# Patient Record
Sex: Female | Born: 1935 | Race: White | Hispanic: No | Smoking: Never smoker
Health system: Southern US, Community
[De-identification: ages and names within clinical notes are randomized; demographics above are authoritative.]

## PROBLEM LIST (undated history)

## (undated) DIAGNOSIS — H269 Unspecified cataract: Secondary | ICD-10-CM

## (undated) DIAGNOSIS — I350 Nonrheumatic aortic (valve) stenosis: Secondary | ICD-10-CM

## (undated) DIAGNOSIS — I10 Essential (primary) hypertension: Secondary | ICD-10-CM

## (undated) DIAGNOSIS — K219 Gastro-esophageal reflux disease without esophagitis: Secondary | ICD-10-CM

## (undated) DIAGNOSIS — R42 Dizziness and giddiness: Secondary | ICD-10-CM

## (undated) DIAGNOSIS — E039 Hypothyroidism, unspecified: Secondary | ICD-10-CM

## (undated) DIAGNOSIS — J449 Chronic obstructive pulmonary disease, unspecified: Secondary | ICD-10-CM

## (undated) HISTORY — PX: ABDOMINAL HYSTERECTOMY: SHX81

## (undated) HISTORY — PX: CATARACT EXTRACTION: SUR2

## (undated) HISTORY — PX: APPENDECTOMY: SHX54

## (undated) HISTORY — PX: CARPAL TUNNEL RELEASE: SHX101

## (undated) HISTORY — PX: CHOLECYSTECTOMY: SHX55

---

## 2004-09-07 ENCOUNTER — Ambulatory Visit: Payer: Self-pay | Admitting: Internal Medicine

## 2004-09-19 ENCOUNTER — Ambulatory Visit: Payer: Self-pay

## 2004-09-29 ENCOUNTER — Ambulatory Visit: Payer: Self-pay

## 2004-10-13 ENCOUNTER — Ambulatory Visit: Payer: Self-pay

## 2004-12-30 ENCOUNTER — Ambulatory Visit: Payer: Self-pay

## 2005-01-17 ENCOUNTER — Ambulatory Visit: Payer: Self-pay | Admitting: Internal Medicine

## 2005-06-09 ENCOUNTER — Ambulatory Visit: Payer: Self-pay | Admitting: Specialist

## 2006-02-27 ENCOUNTER — Ambulatory Visit: Payer: Self-pay | Admitting: Specialist

## 2006-03-05 ENCOUNTER — Ambulatory Visit: Payer: Self-pay | Admitting: Internal Medicine

## 2006-05-18 ENCOUNTER — Ambulatory Visit: Payer: Self-pay | Admitting: Physician Assistant

## 2006-06-01 ENCOUNTER — Ambulatory Visit: Payer: Self-pay | Admitting: Internal Medicine

## 2006-06-05 ENCOUNTER — Ambulatory Visit: Payer: Self-pay | Admitting: Internal Medicine

## 2006-06-27 ENCOUNTER — Other Ambulatory Visit: Payer: Self-pay

## 2006-07-05 ENCOUNTER — Inpatient Hospital Stay: Payer: Self-pay | Admitting: Unknown Physician Specialty

## 2006-07-10 ENCOUNTER — Encounter: Payer: Self-pay | Admitting: Internal Medicine

## 2006-07-30 ENCOUNTER — Encounter: Payer: Self-pay | Admitting: Internal Medicine

## 2006-12-18 ENCOUNTER — Ambulatory Visit: Payer: Self-pay | Admitting: Specialist

## 2007-03-14 ENCOUNTER — Ambulatory Visit: Payer: Self-pay | Admitting: Internal Medicine

## 2007-05-02 ENCOUNTER — Ambulatory Visit: Payer: Self-pay | Admitting: Unknown Physician Specialty

## 2007-05-08 ENCOUNTER — Ambulatory Visit: Payer: Self-pay | Admitting: Internal Medicine

## 2007-07-17 ENCOUNTER — Ambulatory Visit: Payer: Self-pay | Admitting: Ophthalmology

## 2007-08-01 ENCOUNTER — Ambulatory Visit: Payer: Self-pay | Admitting: Unknown Physician Specialty

## 2007-08-01 ENCOUNTER — Other Ambulatory Visit: Payer: Self-pay

## 2007-08-06 ENCOUNTER — Ambulatory Visit: Payer: Self-pay | Admitting: Unknown Physician Specialty

## 2007-08-12 ENCOUNTER — Ambulatory Visit: Payer: Self-pay | Admitting: Ophthalmology

## 2007-08-21 ENCOUNTER — Ambulatory Visit: Payer: Self-pay | Admitting: Ophthalmology

## 2007-10-11 ENCOUNTER — Ambulatory Visit: Payer: Self-pay | Admitting: Physician Assistant

## 2008-01-20 ENCOUNTER — Ambulatory Visit: Payer: Self-pay | Admitting: Unknown Physician Specialty

## 2008-03-16 ENCOUNTER — Ambulatory Visit: Payer: Self-pay | Admitting: Internal Medicine

## 2009-03-30 ENCOUNTER — Ambulatory Visit: Payer: Self-pay | Admitting: Internal Medicine

## 2010-01-20 ENCOUNTER — Ambulatory Visit: Payer: Self-pay | Admitting: Specialist

## 2010-04-15 ENCOUNTER — Ambulatory Visit: Payer: Self-pay | Admitting: Internal Medicine

## 2010-06-09 ENCOUNTER — Ambulatory Visit: Payer: Self-pay | Admitting: Specialist

## 2010-08-22 ENCOUNTER — Emergency Department: Payer: Self-pay | Admitting: Emergency Medicine

## 2010-09-10 ENCOUNTER — Emergency Department: Payer: Self-pay | Admitting: Emergency Medicine

## 2010-09-14 ENCOUNTER — Encounter: Payer: Self-pay | Admitting: Internal Medicine

## 2010-09-29 ENCOUNTER — Encounter: Payer: Self-pay | Admitting: Internal Medicine

## 2010-11-11 ENCOUNTER — Emergency Department: Payer: Self-pay | Admitting: Emergency Medicine

## 2010-12-12 ENCOUNTER — Ambulatory Visit: Payer: Self-pay | Admitting: Internal Medicine

## 2010-12-14 ENCOUNTER — Ambulatory Visit: Payer: Self-pay | Admitting: Internal Medicine

## 2010-12-29 ENCOUNTER — Ambulatory Visit: Payer: Self-pay | Admitting: Internal Medicine

## 2011-01-29 ENCOUNTER — Ambulatory Visit: Payer: Self-pay | Admitting: Internal Medicine

## 2011-03-22 ENCOUNTER — Ambulatory Visit: Payer: Self-pay | Admitting: Internal Medicine

## 2011-03-31 ENCOUNTER — Ambulatory Visit: Payer: Self-pay | Admitting: Internal Medicine

## 2011-05-17 ENCOUNTER — Ambulatory Visit: Payer: Self-pay | Admitting: Internal Medicine

## 2011-05-31 ENCOUNTER — Ambulatory Visit: Payer: Self-pay | Admitting: Internal Medicine

## 2011-06-05 ENCOUNTER — Ambulatory Visit: Payer: Self-pay | Admitting: Internal Medicine

## 2011-09-06 ENCOUNTER — Ambulatory Visit: Payer: Self-pay | Admitting: Internal Medicine

## 2011-09-30 ENCOUNTER — Ambulatory Visit: Payer: Self-pay | Admitting: Internal Medicine

## 2011-12-27 ENCOUNTER — Ambulatory Visit: Payer: Self-pay | Admitting: Internal Medicine

## 2011-12-27 LAB — IRON AND TIBC
Iron Bind.Cap.(Total): 333 ug/dL (ref 250–450)
Iron: 63 ug/dL (ref 50–170)
Unbound Iron-Bind.Cap.: 270 ug/dL

## 2011-12-29 ENCOUNTER — Ambulatory Visit: Payer: Self-pay | Admitting: Internal Medicine

## 2012-04-22 ENCOUNTER — Ambulatory Visit: Payer: Self-pay | Admitting: Internal Medicine

## 2012-04-22 LAB — CBC CANCER CENTER
Eosinophil %: 1.9 %
HCT: 34.7 % — ABNORMAL LOW (ref 35.0–47.0)
HGB: 11.1 g/dL — ABNORMAL LOW (ref 12.0–16.0)
MCHC: 31.9 g/dL — ABNORMAL LOW (ref 32.0–36.0)
MCV: 94 fL (ref 80–100)
Monocyte #: 0.5 x10 3/mm (ref 0.2–0.9)
Monocyte %: 6.7 %
Neutrophil #: 4.4 x10 3/mm (ref 1.4–6.5)
Platelet: 128 x10 3/mm — ABNORMAL LOW (ref 150–440)
RBC: 3.67 10*6/uL — ABNORMAL LOW (ref 3.80–5.20)
RDW: 14.2 % (ref 11.5–14.5)
WBC: 7.6 x10 3/mm (ref 3.6–11.0)

## 2012-04-22 LAB — IRON AND TIBC
Iron Bind.Cap.(Total): 352 ug/dL (ref 250–450)
Iron Saturation: 26 %
Iron: 93 ug/dL (ref 50–170)
Unbound Iron-Bind.Cap.: 259 ug/dL

## 2012-04-22 LAB — FERRITIN: Ferritin (ARMC): 14 ng/mL (ref 8–388)

## 2012-04-29 ENCOUNTER — Ambulatory Visit: Payer: Self-pay | Admitting: Internal Medicine

## 2012-05-23 ENCOUNTER — Ambulatory Visit: Payer: Self-pay | Admitting: Orthopedic Surgery

## 2012-06-05 ENCOUNTER — Ambulatory Visit: Payer: Self-pay | Admitting: Internal Medicine

## 2012-08-30 ENCOUNTER — Emergency Department: Payer: Self-pay | Admitting: Emergency Medicine

## 2012-08-30 LAB — COMPREHENSIVE METABOLIC PANEL
Albumin: 3.4 g/dL (ref 3.4–5.0)
Alkaline Phosphatase: 85 U/L (ref 50–136)
Anion Gap: 7 (ref 7–16)
BUN: 28 mg/dL — ABNORMAL HIGH (ref 7–18)
Bilirubin,Total: 0.4 mg/dL (ref 0.2–1.0)
Calcium, Total: 9.4 mg/dL (ref 8.5–10.1)
Chloride: 105 mmol/L (ref 98–107)
Creatinine: 1.14 mg/dL (ref 0.60–1.30)
EGFR (African American): 49 — ABNORMAL LOW
Glucose: 104 mg/dL — ABNORMAL HIGH (ref 65–99)
Osmolality: 289 (ref 275–301)
Potassium: 3.9 mmol/L (ref 3.5–5.1)
SGOT(AST): 17 U/L (ref 15–37)
Sodium: 142 mmol/L (ref 136–145)

## 2012-08-30 LAB — URINALYSIS, COMPLETE
Bilirubin,UR: NEGATIVE
Glucose,UR: NEGATIVE mg/dL (ref 0–75)
Leukocyte Esterase: NEGATIVE
Nitrite: NEGATIVE
Protein: NEGATIVE
RBC,UR: <1 /HPF (ref 0–5)
Specific Gravity: 1.006 (ref 1.003–1.030)
Squamous Epithelial: NONE SEEN
WBC UR: <1 /HPF (ref 0–5)

## 2012-08-30 LAB — CBC
HCT: 33.9 % — ABNORMAL LOW (ref 35.0–47.0)
HGB: 11.3 g/dL — ABNORMAL LOW (ref 12.0–16.0)
MCH: 30.8 pg (ref 26.0–34.0)
MCHC: 33.4 g/dL (ref 32.0–36.0)

## 2013-06-11 ENCOUNTER — Ambulatory Visit: Payer: Self-pay | Admitting: Internal Medicine

## 2014-06-17 ENCOUNTER — Ambulatory Visit: Payer: Self-pay | Admitting: Family Medicine

## 2015-01-18 ENCOUNTER — Inpatient Hospital Stay: Payer: Self-pay | Admitting: Internal Medicine

## 2015-01-20 ENCOUNTER — Encounter
Admit: 2015-01-20 | Disposition: A | Discharge status: Home / Self Care | Payer: Self-pay | Attending: Internal Medicine | Admitting: Internal Medicine

## 2015-01-21 LAB — BASIC METABOLIC PANEL
ANION GAP: 4 — AB (ref 7–16)
BUN: 30 mg/dL — ABNORMAL HIGH
CALCIUM: 8.7 mg/dL — AB
CO2: 28 mmol/L
Chloride: 109 mmol/L
Creatinine: 1.04 mg/dL — ABNORMAL HIGH
GFR CALC AF AMER: 54 — AB
GFR CALC NON AF AMER: 47 — AB
GLUCOSE: 105 mg/dL — AB
Potassium: 3.3 mmol/L — ABNORMAL LOW
Sodium: 141 mmol/L

## 2015-01-21 LAB — PLATELET COUNT: PLATELETS: 133 10*3/uL — AB (ref 150–440)

## 2015-01-22 LAB — CLOSTRIDIUM DIFFICILE(ARMC)

## 2015-01-23 LAB — COMPREHENSIVE METABOLIC PANEL
ANION GAP: 6 — AB (ref 7–16)
Albumin: 3 g/dL — ABNORMAL LOW
Alkaline Phosphatase: 47 U/L
BUN: 32 mg/dL — AB
Bilirubin,Total: 0.5 mg/dL
Calcium, Total: 8.9 mg/dL
Chloride: 118 mmol/L — ABNORMAL HIGH
Co2: 20 mmol/L — ABNORMAL LOW
Creatinine: 1.23 mg/dL — ABNORMAL HIGH
EGFR (Non-African Amer.): 38 — ABNORMAL LOW
GFR CALC AF AMER: 44 — AB
GLUCOSE: 86 mg/dL
POTASSIUM: 3.5 mmol/L
SGOT(AST): 28 U/L
SGPT (ALT): 32 U/L
SODIUM: 144 mmol/L
TOTAL PROTEIN: 5.8 g/dL — AB

## 2015-01-23 LAB — CBC WITH DIFFERENTIAL/PLATELET
BASOS ABS: 0 10*3/uL (ref 0.0–0.1)
BASOS PCT: 0.3 %
EOS ABS: 0.2 10*3/uL (ref 0.0–0.7)
Eosinophil %: 2.5 %
HCT: 31.2 % — ABNORMAL LOW (ref 35.0–47.0)
HGB: 9.7 g/dL — AB (ref 12.0–16.0)
LYMPHS PCT: 11.1 %
Lymphocyte #: 0.8 10*3/uL — ABNORMAL LOW (ref 1.0–3.6)
MCH: 24.3 pg — ABNORMAL LOW (ref 26.0–34.0)
MCHC: 31 g/dL — AB (ref 32.0–36.0)
MCV: 79 fL — ABNORMAL LOW (ref 80–100)
Monocyte #: 0.4 x10 3/mm (ref 0.2–0.9)
Monocyte %: 6 %
NEUTROS PCT: 80.1 %
Neutrophil #: 5.9 10*3/uL (ref 1.4–6.5)
PLATELETS: 168 10*3/uL (ref 150–440)
RBC: 3.98 10*6/uL (ref 3.80–5.20)
RDW: 18.6 % — ABNORMAL HIGH (ref 11.5–14.5)
WBC: 7.3 10*3/uL (ref 3.6–11.0)

## 2015-01-23 LAB — TSH: THYROID STIMULATING HORM: 0.195 u[IU]/mL — AB

## 2015-01-24 LAB — RAPID INFLUENZA A&B ANTIGENS

## 2015-01-25 LAB — CBC WITH DIFFERENTIAL/PLATELET
BASOS ABS: 0 10*3/uL (ref 0.0–0.1)
Basophil %: 0.6 %
EOS ABS: 0.2 10*3/uL (ref 0.0–0.7)
EOS PCT: 2.8 %
HCT: 31.2 % — ABNORMAL LOW (ref 35.0–47.0)
HGB: 9.7 g/dL — AB (ref 12.0–16.0)
LYMPHS PCT: 13.4 %
Lymphocyte #: 1.1 10*3/uL (ref 1.0–3.6)
MCH: 24.5 pg — ABNORMAL LOW (ref 26.0–34.0)
MCHC: 31.1 g/dL — AB (ref 32.0–36.0)
MCV: 79 fL — ABNORMAL LOW (ref 80–100)
Monocyte #: 0.5 x10 3/mm (ref 0.2–0.9)
Monocyte %: 6.3 %
NEUTROS PCT: 76.9 %
Neutrophil #: 6.1 10*3/uL (ref 1.4–6.5)
Platelet: 163 10*3/uL (ref 150–440)
RBC: 3.96 10*6/uL (ref 3.80–5.20)
RDW: 18.4 % — ABNORMAL HIGH (ref 11.5–14.5)
WBC: 7.9 10*3/uL (ref 3.6–11.0)

## 2015-01-25 LAB — BASIC METABOLIC PANEL
Anion Gap: 4 — ABNORMAL LOW (ref 7–16)
Anion Gap: 5 — ABNORMAL LOW (ref 7–16)
BUN: 26 mg/dL — ABNORMAL HIGH
BUN: 17 mg/dL
CHLORIDE: 110 mmol/L
CO2: 30 mmol/L
CREATININE: 0.99 mg/dL
CREATININE: 1.12 mg/dL — AB
Calcium, Total: 8.9 mg/dL
Calcium, Total: 8 mg/dL — ABNORMAL LOW
Chloride: 118 mmol/L — ABNORMAL HIGH
Co2: 18 mmol/L — ABNORMAL LOW
GFR CALC AF AMER: 58 — AB
GFR CALC AF AMER: 50 — AB
GFR CALC NON AF AMER: 50 — AB
GFR CALC NON AF AMER: 43 — AB
GLUCOSE: 113 mg/dL — AB
Glucose: 74 mg/dL
Potassium: 3.4 mmol/L — ABNORMAL LOW
Potassium: 3.8 mmol/L
Sodium: 144 mmol/L
Sodium: 141 mmol/L

## 2015-01-29 ENCOUNTER — Encounter
Admit: 2015-01-29 | Disposition: A | Discharge status: Home / Self Care | Payer: Self-pay | Attending: Internal Medicine | Admitting: Internal Medicine

## 2015-02-15 LAB — URINALYSIS, COMPLETE
Bilirubin,UR: NEGATIVE
Blood: NEGATIVE
Glucose,UR: NEGATIVE mg/dL (ref 0–75)
KETONE: NEGATIVE
Leukocyte Esterase: NEGATIVE
Nitrite: NEGATIVE
PH: 5 (ref 4.5–8.0)
PROTEIN: NEGATIVE
SPECIFIC GRAVITY: 1.008 (ref 1.003–1.030)
SQUAMOUS EPITHELIAL: NONE SEEN

## 2015-02-17 LAB — URINE CULTURE

## 2015-02-28 ENCOUNTER — Encounter
Admission: RE | Admit: 2015-02-28 | Discharge: 2015-02-28 | Disposition: A | Discharge status: Home / Self Care | Payer: Medicare Other | Source: Ambulatory Visit | Attending: Internal Medicine | Admitting: Internal Medicine

## 2015-02-28 NOTE — Consult Note (Signed)
Chief Complaint:  Subjective/Chief Complaint Patient is still with recurrent persistent shortness of breath and intermittent chest pain no leg edema no sputum production not sleeping really well   VITAL SIGNS/ANCILLARY NOTES: **Vital Signs.:   21-Mar-16 08:11  Vital Signs Type Q 4hr  Celsius 36.2  Temperature Source oral  Pulse Pulse 81  Respirations Respirations 18  Systolic BP Systolic BP 563  Diastolic BP (mmHg) Diastolic BP (mmHg) 76  Mean BP 110  Pulse Ox % Pulse Ox % 99  Pulse Ox Activity Level  At rest  Oxygen Delivery 1L; Nasal Cannula  *Intake and Output.:   Daily 21-Mar-16 07:00  Grand Totals Intake:   Output:  200    Net:  -200 24 Hr.:  -200  Urine ml     Out:  200  Length of Stay Totals Intake:   Output:  325    Net:  -325   Brief Assessment:  GEN well developed, well nourished, no acute distress, thin   Cardiac murmur present  + thrills  -- carotid bruits  + LE edema   Respiratory normal resp effort  clear BS  rhonchi  diminished breath sounds bilateral   Gastrointestinal details normal Soft   EXTR negative cyanosis/clubbing, positive edema   Lab Results: Routine Chem:  21-Mar-16 04:19   Glucose, Serum  144 (65-99 NOTE: New Reference Range  12/22/14)  BUN  37 (6-20 NOTE: New Reference Range  12/22/14)  Creatinine (comp)  1.18 (0.44-1.00 NOTE: New Reference Range  12/22/14)  Sodium, Serum 142 (135-145 NOTE: New Reference Range  12/22/14)  Potassium, Serum 3.7 (3.5-5.1 NOTE: New Reference Range  12/22/14)  Chloride, Serum 107 (101-111 NOTE: New Reference Range  12/22/14)  CO2, Serum 27 (22-32 NOTE: New Reference Range  12/22/14)  Calcium (Total), Serum 9.1 (8.9-10.3 NOTE: New Reference Range  12/22/14)  Anion Gap 8  eGFR (African American)  47  eGFR (Non-African American)  40 (eGFR values <39m/min/1.73 m2 may be an indication of chronic kidney disease (CKD). Calculated eGFR is useful in patients with stable renal function. The eGFR  calculation will not be reliable in acutely ill patients when serum creatinine is changing rapidly. It is not useful in patients on dialysis. The eGFR calculation may not be applicable to patients at the low and high extremes of body sizes, pregnant women, and vegetarians.)   Radiology Results: XRay:    19-Mar-16 15:53, Chest PA and Lateral  Chest PA and Lateral   REASON FOR EXAM:    worsening SOB and cough, hx of COPD  COMMENTS:       PROCEDURE: DXR - DXR CHEST PA (OR AP) AND LATERAL  - Jan 16 2015  3:53PM     CLINICAL DATA:  Worsening shortness of Breath    EXAM:  CHEST  2 VIEW    COMPARISON:  09/10/2010    FINDINGS:  The cardiac shadow is mildly enlarged. Central vascular congestion  is seen some right basilar atelectasis with associated small  effusion is noted. Minimal interstitial edema is seen. No focal  confluent infiltrate is noted.     IMPRESSION:  Mild vascular congestion with associated small right pleural  effusion. Some right basilar atelectasis is noted as well.      Electronically Signed    By: MInez CatalinaM.D.    On: 01/16/2015 16:15         Verified By: MEverlene Farrier M.D.,  Cardiology:    19-Mar-16 15:26, ECG  Ventricular Rate 68  Atrial  Rate 277  QRS Duration 126  QT 376  QTc 399  R Axis -55  T Axis 61  ECG interpretation   Atrial fibrillation  Left axis deviation  Non-specific intra-ventricular conduction block  Cannot rule out Anteroseptal infarct , age undetermined  Abnormal ECG  No previous ECGs available  ----------unconfirmed----------  Confirmed by OVERREAD, NOT ([id number]), editor PEARSON, BARBARA ([id number]) on 01/18/2015 7:58:26 AM  ECG     20-Mar-16 09:44, Echo Doppler  Echo Doppler   REASON FOR EXAM:      COMMENTS:       PROCEDURE: [place] - ECHO DOPPLER COMPLETE(TRANSTHOR)  - Jan 17 2015  9:44AM     RESULT: Echocardiogram Report    Patient Name:   Latasha Beck Date of Exam: 01/17/2015  Medical Rec #:  [id number]        Custom1:  Date  ofBirth:  [birth year]     Height:       65.0 in  Patient Age:    81 years      Weight:       127.0 lb  Patient Gender: F             BSA:          1.63 m??    Indications: Atrial Fib  Sonographer:    Janalee Dane RCS  Referring Phys: Loletha Grayer, J    Summary:   1. Left ventricular ejection fraction, by visual estimation, is 70 to   75%.   2. Normal global left ventricular systolic function.   3. Mildly dilated left atrium.   4. Mildly dilated right atrium.   5. Severe aortic valve stenosis.  6. Moderately elevated pulmonary artery systolic pressure.   7. Mild to moderate tricuspid regurgitation.  2D AND M-MODE MEASUREMENTS (normal ranges within parentheses):  Left Ventricle:          Normal  IVSd (2D):      0.93 cm (0.7-1.1)  LVPWd (2D):    0.95 cm (0.7-1.1) Aorta/LA:                  Normal  LVIDd (2D):     4.57 cm (3.4-5.7) Aortic Root (2D): 2.70 cm (2.4-3.7)  LVIDs (2D):     2.44 cm           Left Atrium (2D): 4.30 cm (1.9-4.0)  LV FS (2D):     46.6 %   (>25%)  LV EF (2D):     78.1 %   (>50%)                                    Right Ventricle:                                    RVd (2D):        2.95 cm  LV DIASTOLIC FUNCTION:  MV Peak E: 1.45 m/s E/e' Ratio: 14.60  MV Peak A: 0.78 m/s Decel Time: 208 msec  E/A Ratio: 1.85  SPECTRAL DOPPLER ANALYSIS (where applicable):  Mitral Valve:  MV P1/2 Time: 60.32 msec  MV Area, PHT: 3.65 cm??  Aortic Valve: AoV Max Vel: 4.76 m/s AoV Peak PG: 90.5 mmHg AoV Mean PG:     51.0 mmHg  LVOT Vmax: 1.12 m/s LVOT VTI: 0.223 m LVOT Diameter: 1.80 cm  AoV Area, Vmax:  0.60 cm?? AoV Area, VTI: 0.57 cm?? AoV Area, Vmn: 0.64 cm??  Tricuspid Valve and PA/RV Systolic Pressure: TR Max Velocity: 3.26 m/s RA   Pressure: 10 mmHg RVSP/PASP: 52.4 mmHg    PHYSICIAN INTERPRETATION:  Left Ventricle: The leftventricular internal cavity size was normal. LV   septal wall thickness was normal. LV posterior wall thickness was normal.   Global LV  systolic function was normal. Left ventricular ejection   fraction, by visual estimation, is 70 to 75%.  Right Ventricle: The right ventricular size is mildly enlarged. Global RV   systolic function is normal.  Left Atrium: The left atrium is mildly dilated.  Right Atrium: The right atrium is mildly dilated.  Pericardium: There is no evidence of pericardial effusion.  Mitral Valve: The mitral valve is normal in structure. Trace mitral valve   regurgitation is seen.  Tricuspid Valve: The tricuspid valve is normal. Mild to moderate   tricuspid regurgitation is visualized. The tricuspid regurgitant velocity   is3.26 m/s, and with an assumed right atrial pressure of 10 mmHg, the   estimated right ventricular systolic pressure is moderately elevated at   52.4 mmHg.  Aortic Valve: Severe aortic stenosis is present. Trivial aortic valve   regurgitation is seen.  Pulmonic Valve: The pulmonic valve is normal. No indication of pulmonic   valve regurgitation.    [place] MD  Electronically signed by [place] MD  Signature Date/Time: 01/17/2015/3:41:42 PM    *** Final ***    IMPRESSION: .        Verified By: Yolonda Kida, M.D., MD   Assessment/Plan:  Assessment/Plan:  Assessment IMP   acute on chronic congestive heart failure diastolic  severe aortic stenosis symptomatic with heart failure  hypertension  renal insufficiency  hyperlipidemia  shortness of breath  insomnia  unstable angina  borderline troponin .   Plan continued telemetry  continue Lasix therapy for shortness of breath and heart failure  beta-blockade therapy  low-dose ACE-inhibitor therapy  rule out for myocardial infarction  DVT prophylaxis  aspirin therapy for appears ASCVD vascular disease  lipid management with statin therapy  physical therapy for ataxia and gait training  consider cardiac catheterization for evaluation of aortic valve  unstable angina and heart failure   consider intervention for symptomatic aortic valve  case discussed with hospitalist  will discuss case with  daughter   Electronic Signatures: Lujean Amel D (MD)  (Signed 21-Mar-16 11:10)  Authored: Chief Complaint, VITAL SIGNS/ANCILLARY NOTES, Brief Assessment, Lab Results, Radiology Results, Assessment/Plan   Last Updated: 21-Mar-16 11:10 by Lujean Amel D (MD)

## 2015-02-28 NOTE — Consult Note (Signed)
Chief Complaint:  Subjective/Chief Complaint The patient has agreed to undergo cardiac catheterization because of persistent shortness of breath and evaluation of coronary disease possibly as well as aortic stenosis   VITAL SIGNS/ANCILLARY NOTES: **Vital Signs.:   22-Mar-16 11:35  Vital Signs Type Routine  Temperature Temperature (F) 98.6  Celsius 37  Temperature Source oral  Pulse Pulse 81  Respirations Respirations 18  Systolic BP Systolic BP 828  Diastolic BP (mmHg) Diastolic BP (mmHg) 68  Mean BP 92  Pulse Ox % Pulse Ox % 94  Pulse Ox Activity Level  At rest  Oxygen Delivery Room Air/ 21 %  *Intake and Output.:   Daily 22-Mar-16 07:00  Grand Totals Intake:  480 Output:  400    Net:  80 24 Hr.:  80  Oral Intake      In:  480  Urine ml     Out:  400  Length of Stay Totals Intake:  480 Output:  725    Net:  -245   Brief Assessment:  GEN well developed, well nourished, no acute distress, thin   Cardiac murmur present  + thrills  -- carotid bruits  + LE edema   Respiratory normal resp effort  clear BS  rhonchi  diminished breath sounds bilateral   Gastrointestinal details normal Soft   EXTR negative cyanosis/clubbing, positive edema   Lab Results: Routine Chem:  22-Mar-16 04:56   Glucose, Serum 93 (65-99 NOTE: New Reference Range  12/22/14)  BUN  37 (6-20 NOTE: New Reference Range  12/22/14)  Creatinine (comp)  1.13 (0.44-1.00 NOTE: New Reference Range  12/22/14)  Sodium, Serum 140 (135-145 NOTE: New Reference Range  12/22/14)  Potassium, Serum 3.5 (3.5-5.1 NOTE: New Reference Range  12/22/14)  Chloride, Serum 109 (101-111 NOTE: New Reference Range  12/22/14)  CO2, Serum 30 (22-32 NOTE: New Reference Range  12/22/14)  Calcium (Total), Serum  8.6 (8.9-10.3 NOTE: New Reference Range  12/22/14)  Anion Gap  1  eGFR (African American)  49  eGFR (Non-African American)  42 (eGFR values <99m/min/1.73 m2 may be an indication of chronic kidney disease  (CKD). Calculated eGFR is useful in patients with stable renal function. The eGFR calculation will not be reliable in acutely ill patients when serum creatinine is changing rapidly. It is not useful in patients on dialysis. The eGFR calculation may not be applicable to patients at the low and high extremes of body sizes, pregnant women, and vegetarians.)   Radiology Results: XRay:    19-Mar-16 15:53, Chest PA and Lateral  Chest PA and Lateral   REASON FOR EXAM:    worsening SOB and cough, hx of COPD  COMMENTS:       PROCEDURE: DXR - DXR CHEST PA (OR AP) AND LATERAL  - Jan 16 2015  3:53PM     CLINICAL DATA:  Worsening shortness of Breath    EXAM:  CHEST  2 VIEW    COMPARISON:  09/10/2010    FINDINGS:  The cardiac shadow is mildly enlarged. Central vascular congestion  is seen some right basilar atelectasis with associated small  effusion is noted. Minimal interstitial edema is seen. No focal  confluent infiltrate is noted.     IMPRESSION:  Mild vascular congestion with associated small right pleural  effusion. Some right basilar atelectasis is noted as well.      Electronically Signed    By: MInez CatalinaM.D.    On: 01/16/2015 16:15         Verified  By: Everlene Farrier, M.D.,  Cardiology:    19-Mar-16 15:26, ECG  Ventricular Rate 68  Atrial Rate 277  QRS Duration 126  QT 376  QTc 399  R Axis -55  T Axis 61  ECG interpretation   Atrial fibrillation  Left axis deviation  Non-specific intra-ventricular conduction block  Cannot rule out Anteroseptal infarct , age undetermined  Abnormal ECG  No previous ECGs available  ----------unconfirmed----------  Confirmed by OVERREAD, NOT ([id number]), editor PEARSON, BARBARA ([id number]) on 01/18/2015 7:58:26 AM  ECG     20-Mar-16 09:44, Echo Doppler  Echo Doppler   REASON FOR EXAM:      COMMENTS:       PROCEDURE: [place] - ECHO DOPPLER COMPLETE(TRANSTHOR)  - Jan 17 2015  9:44AM     RESULT: Echocardiogram Report    Patient Name:    Latasha Beck Date of Exam: 01/17/2015  Medical Rec #:  [id number]        Custom1:  Date ofBirth:  1921-10-01     Height:       65.0 in  Patient Age:    81 years      Weight:       127.0 lb  Patient Gender: F             BSA:          1.63 m??    Indications: Atrial Fib  Sonographer:    Janalee Dane RCS  Referring Phys: Loletha Grayer, J    Summary:   1. Left ventricular ejection fraction, by visual estimation, is 70 to   75%.   2. Normal global left ventricular systolic function.   3. Mildly dilated left atrium.   4. Mildly dilated right atrium.   5. Severe aortic valve stenosis.  6. Moderately elevated pulmonary artery systolic pressure.   7. Mild to moderate tricuspid regurgitation.  2D AND M-MODE MEASUREMENTS (normal ranges within parentheses):  Left Ventricle:          Normal  IVSd (2D):      0.93 cm (0.7-1.1)  LVPWd (2D):    0.95 cm (0.7-1.1) Aorta/LA:                  Normal  LVIDd (2D):     4.57 cm (3.4-5.7) Aortic Root (2D): 2.70 cm (2.4-3.7)  LVIDs (2D):     2.44 cm           Left Atrium (2D): 4.30 cm (1.9-4.0)  LV FS (2D):     46.6 %   (>25%)  LV EF (2D):     78.1 %   (>50%)                                    Right Ventricle:                                    RVd (2D):        9.19 cm  LV DIASTOLIC FUNCTION:  MV Peak E: 1.45 m/s E/e' Ratio: 14.60  MV Peak A: 0.78 m/s Decel Time: 208 msec  E/A Ratio: 1.85  SPECTRAL DOPPLER ANALYSIS (where applicable):  Mitral Valve:  MV P1/2 Time: 60.32 msec  MV Area, PHT: 3.65 cm??  Aortic Valve: AoV Max Vel: 4.76 m/s AoV Peak PG: 90.5 mmHg AoV Mean PG:  51.0 mmHg  LVOT Vmax: 1.12 m/s LVOT VTI: 0.223 m LVOT Diameter: 1.80 cm  AoV Area, Vmax: 0.60 cm?? AoV Area, VTI: 0.57 cm?? AoV Area, Vmn: 0.64 cm??  Tricuspid Valve and PA/RV Systolic Pressure: TR Max Velocity: 3.26 m/s RA   Pressure: 10 mmHg RVSP/PASP: 52.4 mmHg    PHYSICIAN INTERPRETATION:  Left Ventricle: The leftventricular internal cavity size was normal. LV   septal  wall thickness was normal. LV posterior wall thickness was normal.   Global LV systolic function was normal. Left ventricular ejection   fraction, by visual estimation, is 70 to 75%.  Right Ventricle: The right ventricular size is mildly enlarged. Global RV   systolic function is normal.  Left Atrium: The left atrium is mildly dilated.  Right Atrium: The right atrium is mildly dilated.  Pericardium: There is no evidence of pericardial effusion.  Mitral Valve: The mitral valve is normal in structure. Trace mitral valve   regurgitation is seen.  Tricuspid Valve: The tricuspid valve is normal. Mild to moderate   tricuspid regurgitation is visualized. The tricuspid regurgitant velocity   is3.26 m/s, and with an assumed right atrial pressure of 10 mmHg, the   estimated right ventricular systolic pressure is moderately elevated at   52.4 mmHg.  Aortic Valve: Severe aortic stenosis is present. Trivial aortic valve   regurgitation is seen.  Pulmonic Valve: The pulmonic valve is normal. No indication of pulmonic   valve regurgitation.    [place] MD  Electronically signed by [place] MD  Signature Date/Time: 01/17/2015/3:41:42 PM    *** Final ***    IMPRESSION: .        Verified By: Yolonda Kida, M.D., MD   Assessment/Plan:  Assessment/Plan:  Assessment IMP  unstable angina   acute on chronic congestive heart failure diastolic  severe aortic stenosis symptomatic with heart failure  hypertension  renal insufficiency  hyperlipidemia  shortness of breath  insomnia  unstable angina  borderline troponin .   Plan recommend cardiac catheterization today continued telemetry  continue Lasix therapy for shortness of breath and heart failure  beta-blockade therapy  low-dose ACE-inhibitor therapy  rule out for myocardial infarction  DVT prophylaxis  aspirin therapy for appears ASCVD vascular disease  lipid management with statin therapy  physical  therapy for ataxia and gait training   consider intervention for symptomatic aortic valve  case discussed with hospitalist  will discuss case with  daughter   Electronic Signatures: Lujean Amel D (MD)  (Signed 22-Mar-16 13:03)  Authored: Chief Complaint, VITAL SIGNS/ANCILLARY NOTES, Brief Assessment, Lab Results, Radiology Results, Assessment/Plan   Last Updated: 22-Mar-16 13:03 by Yolonda Kida (MD)

## 2015-02-28 NOTE — Consult Note (Signed)
PATIENT NAME:  Latasha Beck, Latasha Beck MR#:  [id number] DATE OF BIRTH:  1921-07-02  DATE OF CONSULTATION:  01/17/2015  PRIMARY PHYSICIAN:  Kandyce Rud, MD   REFERRING PHYSICIAN:  Herschell Dimes. Renae Gloss, MD CONSULTING PHYSICIAN:  Dwayne D. Callwood, MD  INDICATION: Shortness of breath.   HISTORY OF PRESENT ILLNESS: The patient is a 81 year old white female with history of severe aortic stenosis, COPD, shortness of breath past month, followed by Dr. Meredeth Ide in pulmonary.  The patient complains of persistent shortness of breath which has gotten relatively worse. She wears oxygen at home. She was on doxycycline for what was thought to be bronchitis, which gave her upset stomach and diarrhea, so she discontinued it a few weeks ago. The patient complained of a few weeks ago having significant chest pain which lasted 5-10 minutes, but was particularly severe and it involved her entire chest. She took some Tylenol and eventually the pain went away.  She was short of breath, but had no nausea, vomiting, or diaphoresis. The pain was particularly severe, so she remembered it.  She eventually told her daughter, who came to check on her, because the patient states she felt weak and short of breath since that episode.  There has been some problems with confusion and with her memory. She has had some mild dizziness, but her biggest complaint is shortness of breath, which is severe, weakness, and fatigue. She has not had any more further chest pain since that episode, but her  daughter brought her to the Emergency Room for evaluation with that history of persistent dyspnea.   PAST MEDICAL HISTORY: Severe aortic stenosis, COPD, asthma, arthritis, unsteady gait, anemia, reflux, hypothyroidism.   PAST SURGICAL HISTORY:  Two ribs removed at age 60 secondary to pleurisy, appendectomy, hysterectomy, right carpal tunnel release, cholecystectomy, 2 hip replacements.   ALLERGIES: SULFA, SYMBICORT, POSSIBLY TO ASPIRIN, CIPRO, CRESTOR,  REGLAN.  SOCIAL HISTORY: Lives alone, widow. No smoking. No alcohol consumption. She is a retired Engineer, civil (consulting).   FAMILY HISTORY: Father died at 30 with heart disease. Mother died at 55 with heart disease.  MEDICATIONS: Reportedly she is on albuterol nebulizer 4 times a day and she is on Xanax 0.5 mg at bedtime to help her sleep,ASA 81 mg a day, Deplin 15 mg once a day, Lexapro 10 mg a day, , fish oil 1000 mg twice a day, Flovent 110 twice a day, Lasix 20 mg daily,  levothyroxine 75 mcg a day, loperamide 2 mg once a day as needed for diarrhea. Meclizine 25 mg 2 times a day for vertigo, Protonix 40 mg a day, Serevent 50 mcg inhaled twice a day, Singulair 10 mg a day, Spiriva 1 inhaled daily, TUMS p.r.n., Tylenol p.r.n.   REVIEW OF SYSTEMS: Denies blackout spells or syncope. No nausea or vomiting. No fever, no chills, no sweats. Denies weight loss, weight gain. No hemoptysis, hematemesis. Denies bright red blood per rectum.  She has had some shortness of breath, chest pain, weakness, fatigue, vertigo, mild confusion.   PHYSICAL EXAMINATION: VITAL SIGNS: Blood pressure 160/70, pulse 65. respiratory rate of 18, afebrile.  HEENT: Normocephalic, atraumatic. Pupils equal and reactive to light.  NECK: Supple. No JVD, bruits, or adenopathy.  LUNGS: Clear with rales in the bases. Decreased breath sounds bilaterally, diffuse rhonchi. No wheezing.  HEART: Regular rate and rhythm. Systolic ejection murmur along left sternal border, 3/6 soft diastolic murmur. Positive S4. PMI nondisplaced.  ABDOMEN: Benign.  EXTREMITIES: Within normal limits.  NEUROLOGIC: Intact.  SKIN: Normal.  OVERALL LABORATORIES: PT/INR are normal. UA was negative. Troponin 0.07, magnesium 1.8, lipase 46, white count 7.8, hemoglobin of 10.3, hematocrit 33.7, platelet count of 165,000. Glucose 86, BUN 32, creatinine 1.15, sodium 142, potassium 4.3, chloride 108, CO2 of 23, calcium was 95. LFTs were normal.   IMAGING:  Chest x-ray, mild  vascular congestion.   EKG: Atrial fibrillation, left bundle branch block, rate of about 60. Not sure if this is new.   ASSESSMENT:  1.  Shortness of breath, possibly heart failure. 2.  Fatigue.  3.  Aortic stenosis, murmur. 4.  Reflux. 5.  Hypothyroidism.  6.  Renal insufficiency.  7.  Arthritis.  8.  Gastroesophageal reflux disease. 9.  Hypertension.   PLAN: 1.  Agree with admit. Treat for heart failure, treat the shortness of breath. Treat for COPD  symptoms and asthma with inhalers and oxygen. Consider pulmonary input for advancement and adjustment of inhaler therapy.  2.  Continue diuretic therapy intravenously to help with diuresis.  3.  Follow up troponins. Follow up EKG.  Atrial fibrillation is not clear at this stage if new or old. The family seems to think this may be new. Her rate is controlled right now. I am not sure that I would recommend anticoagulation with her unsteady gait and her anemia.  4.  Continue reflux therapy. Consider GI evaluation for anemia. 5.  Continue treatment for asthma.  6.  We will reevaluate aortic stenosis as a potential etiology of the shortness of breath or if it is related to her COPD.  We will continue to treat the patient and see how she does. We will discuss the case with the cardiologist.    ____________________________ Bobbie Stackwayne D. Juliann Paresallwood, MD ddc:LT D: 01/17/2015 18:09:00 ET T: 01/17/2015 19:03:53 ET JOB#: [id number]  cc: Dwayne D. Juliann Paresallwood, MD, <Dictator> Alwyn PeaWAYNE D CALLWOOD MD ELECTRONICALLY SIGNED 01/26/2015 9:07

## 2015-02-28 NOTE — H&P (Signed)
PATIENT NAME:  Latasha Beck, Latasha Beck MR#:  [id number] DATE OF BIRTH:  1921/09/26  DATE OF ADMISSION:  01/16/2015  PRIMARY CARE PHYSICIAN: Kandyce Rud, MD  CHIEF COMPLAINT: Shortness of breath.   HISTORY OF PRESENT ILLNESS: This is a 81 year old female with history of severe aortic stenosis, COPD, and asthma. She presents with shortness of breath going on for a while. She does wear oxygen as needed at home. She saw Dr. Meredeth Ide a few weeks ago, and he put her on doxycycline for a bronchitis. She stopped taking the doxycycline secondary to upset stomach and diarrhea. She did have chest pain the other night, lasting anywhere from 2-4 minutes while she was in her house. She went to lie down and it eased off. She did not tell her daughter about it until recently, and she was brought in for further evaluation. The pain was described as severe in the center of her chest, wrapping around the entire chest wall, both sides. No chest pain at this point. She has also been fatigued and weak and always in bed. As per the daughter, had some confusion and some memory loss and some dizziness. In the ER, she was found to have an elevated blood pressure and a borderline troponin, and hospitalist services were contacted for further evaluation. Severe.  PAST MEDICAL HISTORY: Severe aortic stenosis, COPD, asthma, arthritis, unsteady gait, anemia, gastroesophageal reflux disease, hypothyroidism.   PAST SURGICAL HISTORY: Two ribs removed at age 44 secondary to pleurisy, appendectomy, hysterectomy, right carpal tunnel, cholecystectomy, 2 hip replacements.   ALLERGIES: SULFA SYMBICORT. IN THE COMPUTER, ALSO LISTED AS ASPIRIN, CIPRO, CRESTOR AND REGLAN.   SOCIAL HISTORY: Lives alone. No smoking. No alcohol. No drug use. Retired Engineer, civil (consulting).   FAMILY HISTORY: Father died at 70 of heart disease. Mother died at 58 of heart disease.   MEDICATIONS: As per prescription writer include albuterol nebulizer 4 times a day as needed for shortness  of breath, Xanax 0.5 mg once a day at bedtime for sleep, aspirin 81 mg daily, Deplin 15 mg once a day, Lexapro 10 mg daily, Evista 60 mg daily, fish oil 1000 mg twice a day, Flovent HFA 110 mcg per inhalation 1 puff every 12 hours, furosemide 20 mg daily, levothyroxine 75 mcg daily, loperamide 2 mg once a day as needed for diarrhea, meclizine 25 mg 3 times a day as needed for dizziness, melatonin 3 mg 2 tablets at bedtime, ProAir HFA 2 puffs 4 times a day as needed for shortness of breath, Protonix 40 mg in the morning, Serevent 50 mcg 1 inhalation twice a day, Singulair 10 mg daily, Spiriva 1 inhalation daily, stool softener twice a day, TUMS p.r.n., Tylenol p.r.n.   REVIEW OF SYSTEMS:  CONSTITUTIONAL: Positive for fever a few weeks ago. Positive for sweats. No weight loss. No weight gain. Positive for fatigue.  HEENT: Eyes: She does wear glasses. Ears, nose, mouth and throat: Decreased hearing. Positive for runny nose, positive for dry mouth. Positive for dysphagia to water.  CARDIOVASCULAR: Positive for chest pain the other day.  RESPIRATORY: Positive for shortness of breath, clear phlegm, nose. No hemoptysis. No wheeze.  GASTROINTESTINAL: Positive for diarrhea. No nausea. No vomiting. No abdominal pain. No bright red blood per rectum. No melena.  GENITOURINARY: No burning on urination, no hematuria.  MUSCULOSKELETAL: Positive for arthritis pain.  INTEGUMENT: No rashes or eruptions.  NEUROLOGIC: Positive for dizziness.  HEMATOLOGIC AND LYMPHATIC: History of anemia.  ENDOCRINE: Positive for hypothyroidism.   PHYSICAL EXAMINATION: VITAL SIGNS: Temperature 98.1,  pulse 67, respirations 20, blood pressure 168/77, pulse oximetry 97% on room air.  GENERAL: No respiratory distress.  HEENT: Eyes: Conjunctivae and lids normal. Pupils equal, round, and reactive to light. Extraocular muscles intact. No nystagmus. Ears, nose, mouth and throat: Tympanic membranes: No erythema. Nasal mucosa: No erythema.  Throat. Positive for erythema, no exudate seen. Lips and gums: No lesions.  NECK: No JVD. No bruits. No lymphadenopathy. No thyromegaly. No thyroid nodules palpated.  RESPIRATORY: Poor air entry bilaterally. Decreased breath sounds bilateral bases. No rhonchi, rales, or wheeze heard.  CARDIOVASCULAR: S1, S2 normal, a +4/6 systolic ejection murmur. Carotid upstroke 2+ bilaterally. Dorsalis pedis pulses 1+ bilaterally. Trace edema of the lower extremity.   ABDOMEN: Soft, nontender. No organosplenomegaly. Normoactive bowel sounds. No masses felt.  LYMPHATIC: No lymph nodes in the neck.  MUSCULOSKELETAL: No clubbing. Trace edema. No cyanosis.  SKIN: No ulcers or lesions seen.  NEUROLOGIC: Cranial nerves II through XII grossly intact. Deep tendon reflexes 1+ bilateral lower extremities.  PSYCHIATRIC: The patient is oriented to person, place, and time.   LABORATORY AND RADIOLOGICAL DATA: PT/INR: PTT normal range. Urinalysis negative. Troponin borderline at 0.07. Magnesium 1.8. Lipase 46. White blood cell count 7.8, hemoglobin and hematocrit 10.3 and 33.7, platelet count 165,000. Glucose 86, BUN 32, creatinine 1.15, sodium 142, potassium 4.3, chloride 108, CO2 of 26, calcium 9.5. Liver function tests normal range. Albumin low at 3.3. GFR 42. Chest x-ray: Mild vascular congestion, small right pleural effusion. EKG: Atrial fibrillation.   ASSESSMENT AND PLAN: 1. Shortness of breath and chest pain with borderline elevated troponin. There could be a couple reasons here. The patient does have severe aortic stenosis, as per family. I will obtain an echocardiogram to confirm that. This could be a mild congestive heart failure. I will give IV Lasix and see how things go. I will hold off on beta blocker with her history of chronic obstructive pulmonary disease. This could also be chronic obstructive pulmonary disease exacerbation with poor air entry. I will give Solu-Medrol at this point and Zithromax and nebulizer  treatments, and continue to monitor. The elevated troponin is likely secondary to demand ischemia. I will get a cardiology consultation secondary to severe aortic stenosis and the chest pain, and we will get serial cardiac enzymes. Monitor on telemetry. Continue aspirin.  2. Fatigue. Could be secondary to severe aortic stenosis. We will check a TSH just in case this is thyroid-related. We will get a physical therapy evaluation.  3. Hypothyroidism, unspecified. Check a TSH in the a.m. Physical therapy evaluation.  4. Gastroesophageal reflux disease without esophagitis. We will continue Protonix, since she does take that at home.  5. Chronic kidney disease, stage III. Looking back at old records, this is chronic for this patient.  6. Atrial fibrillation. This could be new. I am looking back at old EKG: It looks like left bundle branch block on old EKG: The patient is rate controlled, not on any rate controlling medications. I am hesitant on beta blocker at this point with her history of chronic obstructive pulmonary disease. We will continue aspirin for anticoagulation and have cardiology evaluate that and also echocardiogram.   TIME SPENT ON ADMISSION: 55 minutes.   CODE STATUS: The patient is a DO NOT RESUSCITATE.     ____________________________ Herschell Dimesichard J. Renae GlossWieting, MD rjw:[id number] D: 01/16/2015 18:18:41 ET T: 01/16/2015 18:36:14 ET JOB#: [id number]  cc: Herschell Dimesichard J. Renae GlossWieting, MD, <Dictator> Kandyce RudMarcus Babaoff, MD Darlin PriestlyKenneth A. Lady GaryFath, MD Herbon E. Meredeth IdeFleming, MD  Herschell DimesICHARD J  Renae Gloss MD ELECTRONICALLY SIGNED 01/17/2015 13:32

## 2015-02-28 NOTE — Discharge Summary (Signed)
PATIENT NAME:  Latasha Beck, BIASI MR#:  [id number] DATE OF BIRTH:  [birth year]  DATE OF ADMISSION:  01/16/2015 DATE OF DISCHARGE:  01/21/2015  ADMITTING DIAGNOSES: Shortness of breath.   DISCHARGE DIAGNOSES: 1.  Dyspnea due to acute on chronic diastolic congestive heart failure.  2.  Chest pain.  3.  Severe aortic stenosis status post cardiac catheterization on the 22nd of March 2016 by Dr. Juliann Pares where no significant coronary artery disease was revealed, however severe aortic stenosis was noted.  4.  Unsteady gait, ataxia. 5.  Renal insufficiency 6.  Neck pain, likely cervical degenerative disk disease. 7.  Chronic obstructive pulmonary disease, asthma.  8.  Osteoarthritis. 9.  Gastroesophageal reflux disease without esophagitis.  10.  Hypothyroidism.  11.  Anxiety.   DISCHARGE MEDICATIONS: The patient is to continue: 1.  Fish oil 1 gram twice daily. 2.  Evista 60 mg p.o. every morning. 3.  Singulair 10 mg p.o. in the evening. 4.  Tums 500 mg 3 tablets at bedtime as needed. 5.  Deplin 15 mg p.o. in the morning.  6.  Protonix 40 mg p.o. daily.  7.  Loperamide 2 mg daily as needed.  8.  ProAir HFA 2 puffs 4 times daily as needed.  9.  Flovent 110 mcg inhalation aerosol 1 puff twice daily.  10.  Albuterol 2.5 mg inhalation solution 1 viral 4 times daily as needed.  11.  Meclizine 25 mg 3 times daily as needed.  12.  Aspirin 81 mg p.o. daily.  13.  Melatonin 6 mg p.o. at bedtime.  14.  Serevent Diskus 50 mcg 1 puff twice daily.  15.  Spiriva 1 inhalation once daily.  16.  Escitalopram 10 mg p.o. daily.  17.  Levothyroxine 75 mcg p.o. daily.  18.  Tylenol 500 mg 2 capsules 2 to 4 times daily as needed.  19.  Alprazolam 25 mg daily at bedtime.  20.  Alprazolam 0.25 mg every 6 hours as needed.  21.  Furosemide 20 mg every 48 hours.  22.  Senna 1 tablet daily as needed.  23.  Docusate sodium 100 mg p.o. twice daily.  24.  Norvasc 2.5 mg.   HOME OXYGEN: 2 liters of oxygen through  nasal cannula with portable tank.   DISCHARGE DIET: 2 grams salt, low-fat, low-cholesterol, mechanical soft.   ACTIVITY LIMITATIONS: As tolerated. Referral to physical therapy.  DISCHARGE FOLLOWUP: Follow up with Dr. Larwance Sachs in 2 days after discharge, Dr. Lady Gary or Dr. Juliann Pares in 1 week after discharge.  CONSULTANTS: Care management, social work, Dr. Juliann Pares.   RADIOLOGIC STUDIES: Chest x-ray, PA and lateral, on 19th of March 2016, showed mild vascular congestion with associated small right pleural effusion, some right basilar atelectasis was noted as well.   Echocardiogram 20th of March 2016: Left ventricular ejection fraction by visual estimation 70% to 75%, normal global left ventricular systolic function, mildly dilated left atrium as well as mildly dilated right atrium, severe aortic valve stenosis, moderately elevated pulmonary arterial systolic pressure, mild to moderate tricuspid regurgitation. Aortic valve area 0.64 sq cm.   Cardiac catheterization done 22nd March 2016 did not show any significant coronary artery disease, per discussion with Dr. Juliann Pares. Unfortunately, the patient's cardiac catheterization results are not available during time of dictation.   HISTORY AND HOSPITAL COURSE: The patient is a 81 year old Caucasian female with history of severe aortic stenosis who presented to the hospital with complaints of shortness of breath. Please refer to Dr. Mathews Robinsons admission note on the 19th  of March 2016. On arrival to the hospital, the patient's temperature was 98.1, pulse was 67, respiration rate was 20, blood pressure 168/77, and saturation was 97% on room air. Physical exam revealed 4+/6 systolic ejection murmur. Carotid upstroke was 2+ bilaterally. Dorsalis pedis pulses were 1+ bilaterally. Trace edema in lower extremities was noted. Chest auscultation with poor air entry bilaterally, decreased breath sounds bilateral bases.  Lab data done on arrival to the hospital, 19th of  March 2016, showed elevated BUN and creatinine to 32 and 1.15. Otherwise, BMP was unremarkable. The patient's liver enzymes showed albumin level of 3.3. Troponin was elevated at 0.07 on the first set, 0.10 on the second, and 0.10 on the third set. On the patient's CBC, white blood cell count was 7.8, hemoglobin 10.3, and platelet count 165,000 with low MCV 78. The patient's TSH was found to be low at 0.026; however, the patient's free thyroxine was 1.09, which is within normal limits. Coagulation panel was unremarkable. Urinalysis revealed 2 red blood cells, 1 white blood cell, no bacteria, negative for leukocyte esterase or nitrites. EKG showed A-fib at a rate of 68 beats per minute, left axis deviation, nonspecific intraventricular conduction block.  The patient was admitted to the hospital for further evaluation. Her cardiac enzymes were cycled. Consultation with cardiologist, Dr. Juliann Paresallwood, was obtained. The patient underwent cardiac catheterization on the 27th of March 2016 which did not show significant coronary artery disease. However, it revealed significant severe actually aortic valvular stenosis. Cardiac catheterization results are not available at the time of dictation. However, the patient was told by cardiologist that she would benefit from aortic valve replacement or percutaneous aortic valve procedure. The patient was evaluated by physical therapist who recommended physical therapy in skilled nursing facility. She is going to be discharged to skilled nursing facility to continue her medical therapy and follow up with cardiac surgeon. Interventional cardiologist will be arranged as outpatient.   In regards to dyspnea, it was felt that the patient's dyspnea was due to acute on chronic diastolic CHF. The patient is to continue blood pressure management with low dose of Norvasc and 2.5 mg of Norvasc was added to the patient's regimen. The patient was also advised to continue diuretic every second day  since daily diuretic was dehydrating her significantly and caused mild renal insufficiency. It was also unclear if the patient had an element of COPD exacerbation. However, she was treated for COPD exacerbation with inhalation therapy as well as antibiotics. She completed a 5 day course with Zithromax while in the hospital. She requires still oxygen therapy for her shortness of breath as well as intermittent chest pains upon discharge to rehabilitation facility.   In regards to renal insufficiency, as mentioned above, the patient's renal insufficiency was closely monitored while she was in the hospital with diuretic as well as after cardiac catheterization. On the 22nd of March 2016, the patient's creatinine was 1.13. On the 24th of March 2016, it was 1.04. It is recommended to follow the patient's creatinine levels as outpatient and make decisions about referring her to kidney doctors if needed.   The patient was complaining of some neck pain which was felt to be due to cervical degenerative disk disease. The patient may benefit from pain medications as outpatient as well as followup with Dr. Rosita KeaMenz who in the past treated her cervical degenerative disk disease with injections.  For COPD/asthma, she is to continue her outpatient management. No other changes were made.  For osteoarthritis, she is  to continue Tylenol.  For gastroesophageal reflux disease, she is to continue Protonix.  For hypothyroidism, she is to continue her home dose of levothyroxine.   She is being discharged in stable condition with the above-mentioned medications and follow-up. On the day of discharge, temperature was 98.5, pulse was 76, respiration was 16, blood pressure ranging from 147 to 150 systolic and this is prior to Norvasc administration. O2 sats were 97% on room air and 99% on 2 liters of oxygen through nasal cannula at rest.   TIME SPENT: 40 minutes.  ____________________________ Katharina Caper,  MD rv:sb D: 01/21/2015 10:27:23 ET T: 01/21/2015 11:16:03 ET JOB#: [id number]  cc: Katharina Caper, MD, <Dictator> Kandyce Rud, MD Dwayne D. Juliann Pares, MD Katharina Caper MD ELECTRONICALLY SIGNED 02/07/2015 14:36

## 2015-04-29 ENCOUNTER — Other Ambulatory Visit: Payer: Self-pay

## 2015-04-29 ENCOUNTER — Emergency Department
Admission: EM | Admit: 2015-04-29 | Discharge: 2015-04-29 | Disposition: A | Discharge status: Home / Self Care | Payer: Medicare Other | Attending: Emergency Medicine | Admitting: Emergency Medicine

## 2015-04-29 ENCOUNTER — Emergency Department: Payer: Medicare Other

## 2015-04-29 DIAGNOSIS — I10 Essential (primary) hypertension: Secondary | ICD-10-CM | POA: Insufficient documentation

## 2015-04-29 DIAGNOSIS — R42 Dizziness and giddiness: Secondary | ICD-10-CM | POA: Diagnosis present

## 2015-04-29 DIAGNOSIS — R110 Nausea: Secondary | ICD-10-CM | POA: Diagnosis not present

## 2015-04-29 DIAGNOSIS — R51 Headache: Secondary | ICD-10-CM | POA: Diagnosis not present

## 2015-04-29 HISTORY — DX: Essential (primary) hypertension: I10

## 2015-04-29 HISTORY — DX: Unspecified cataract: H26.9

## 2015-04-29 HISTORY — DX: Dizziness and giddiness: R42

## 2015-04-29 LAB — BASIC METABOLIC PANEL
Anion gap: 12 (ref 5–15)
BUN: 36 mg/dL — ABNORMAL HIGH (ref 6–20)
CO2: 32 mmol/L (ref 22–32)
Calcium: 9.8 mg/dL (ref 8.9–10.3)
Chloride: 98 mmol/L — ABNORMAL LOW (ref 101–111)
Creatinine, Ser: 1.42 mg/dL — ABNORMAL HIGH (ref 0.44–1.00)
GFR calc Af Amer: 36 mL/min — ABNORMAL LOW (ref >60)
GFR calc non Af Amer: 31 mL/min — ABNORMAL LOW (ref >60)
Glucose, Bld: 97 mg/dL (ref 65–99)
Potassium: 3.6 mmol/L (ref 3.5–5.1)
Sodium: 142 mmol/L (ref 135–145)

## 2015-04-29 LAB — URINALYSIS COMPLETE WITH MICROSCOPIC (ARMC ONLY)
Bacteria, UA: NONE SEEN
Bilirubin Urine: NEGATIVE
Glucose, UA: NEGATIVE mg/dL
HGB URINE DIPSTICK: NEGATIVE
Ketones, ur: NEGATIVE mg/dL
Nitrite: NEGATIVE
PH: 5 (ref 5.0–8.0)
PROTEIN: NEGATIVE mg/dL
RBC / HPF: NONE SEEN RBC/hpf (ref 0–5)
SPECIFIC GRAVITY, URINE: 1.01 (ref 1.005–1.030)

## 2015-04-29 LAB — CBC
HCT: 44.5 % (ref 35.0–47.0)
Hemoglobin: 14.7 g/dL (ref 12.0–16.0)
MCH: 31.3 pg (ref 26.0–34.0)
MCHC: 33 g/dL (ref 32.0–36.0)
MCV: 94.9 fL (ref 80.0–100.0)
Platelets: 146 10*3/uL — ABNORMAL LOW (ref 150–440)
RBC: 4.69 MIL/uL (ref 3.80–5.20)
RDW: 13.5 % (ref 11.5–14.5)
WBC: 9 10*3/uL (ref 3.6–11.0)

## 2015-04-29 LAB — TROPONIN I: TROPONIN I: 0.03 ng/mL (ref <0.031)

## 2015-04-29 MED ORDER — MECLIZINE HCL 25 MG PO TABS
25.0000 mg | ORAL_TABLET | Freq: Once | ORAL | Status: AC
  Administered 2015-04-29: 25 mg via ORAL

## 2015-04-29 MED ORDER — MECLIZINE HCL 25 MG PO TABS
ORAL_TABLET | ORAL | Status: AC
  Administered 2015-04-29: 25 mg via ORAL
  Filled 2015-04-29: qty 1

## 2015-04-29 MED ORDER — SODIUM CHLORIDE 0.9 % IV BOLUS (SEPSIS)
1000.0000 mL | Freq: Once | INTRAVENOUS | Status: AC
  Administered 2015-04-29: 1000 mL via INTRAVENOUS

## 2015-04-29 MED ORDER — MECLIZINE HCL 25 MG PO TABS: 25.0000 mg | ORAL_TABLET | Freq: Three times a day (TID) | ORAL | Status: AC | PRN

## 2015-04-29 NOTE — Discharge Instructions (Signed)
Dizziness °Dizziness is a common problem. It is a feeling of unsteadiness or light-headedness. You may feel like you are about to faint. Dizziness can lead to injury if you stumble or fall. A person of any age group can suffer from dizziness, but dizziness is more common in older adults. °CAUSES  °Dizziness can be caused by many different things, including: °· Middle ear problems. °· Standing for too long. °· Infections. °· An allergic reaction. °· Aging. °· An emotional response to something, such as the sight of blood. °· Side effects of medicines. °· Tiredness. °· Problems with circulation or blood pressure. °· Excessive use of alcohol or medicines, or illegal drug use. °· Breathing too fast (hyperventilation). °· An irregular heart rhythm (arrhythmia). °· A low red blood cell count (anemia). °· Pregnancy. °· Vomiting, diarrhea, fever, or other illnesses that cause body fluid loss (dehydration). °· Diseases or conditions such as Parkinson's disease, high blood pressure (hypertension), diabetes, and thyroid problems. °· Exposure to extreme heat. °DIAGNOSIS  °Your health care provider will ask about your symptoms, perform a physical exam, and perform an electrocardiogram (ECG) to record the electrical activity of your heart. Your health care provider may also perform other heart or blood tests to determine the cause of your dizziness. These may include: °· Transthoracic echocardiogram (TTE). During echocardiography, sound waves are used to evaluate how blood flows through your heart. °· Transesophageal echocardiogram (TEE). °· Cardiac monitoring. This allows your health care provider to monitor your heart rate and rhythm in real time. °· Holter monitor. This is a portable device that records your heartbeat and can help diagnose heart arrhythmias. It allows your health care provider to track your heart activity for several days if needed. °· Stress tests by exercise or by giving medicine that makes the heart beat  faster. °TREATMENT  °Treatment of dizziness depends on the cause of your symptoms and can vary greatly. °HOME CARE INSTRUCTIONS  °· Drink enough fluids to keep your urine clear or pale yellow. This is especially important in very hot weather. In older adults, it is also important in cold weather. °· Take your medicine exactly as directed if your dizziness is caused by medicines. When taking blood pressure medicines, it is especially important to get up slowly. °¨ Rise slowly from chairs and steady yourself until you feel okay. °¨ In the morning, first sit up on the side of the bed. When you feel okay, stand slowly while holding onto something until you know your balance is fine. °· Move your legs often if you need to stand in one place for a long time. Tighten and relax your muscles in your legs while standing. °· Have someone stay with you for 1-2 days if dizziness continues to be a problem. Do this until you feel you are well enough to stay alone. Have the person call your health care provider if he or she notices changes in you that are concerning. °· Do not drive or use heavy machinery if you feel dizzy. °· Do not drink alcohol. °SEEK IMMEDIATE MEDICAL CARE IF:  °· Your dizziness or light-headedness gets worse. °· You feel nauseous or vomit. °· You have problems talking, walking, or using your arms, hands, or legs. °· You feel weak. °· You are not thinking clearly or you have trouble forming sentences. It may take a friend or family member to notice this. °· You have chest pain, abdominal pain, shortness of breath, or sweating. °· Your vision changes. °· You notice   any bleeding.  You have side effects from medicine that seems to be getting worse rather than better. MAKE SURE YOU:   Understand these instructions.  Will watch your condition.  Will get help right away if you are not doing well or get worse. Document Released: 04/11/2001 Document Revised: 10/21/2013 Document Reviewed: 05/05/2011 [facility]  Patient Information 2015 [place], [place]. This information is not intended to replace advice given to you by your health care provider. Make sure you discuss any questions you have with your health care provider.     As we have discussed please follow-up with ENT as soon as possible for further evaluation regarding her continued dizziness. Please follow-up with your heart doctor (Dr. Lady GaryFath) to further discuss your recent diagnosis of atrial fibrillation. Return to the emergency department for any increased symptoms, weakness or numbness of any arm or leg, confusion or slurred speech. Please drink plenty of fluids at home, and increase her dietary intake.

## 2015-04-29 NOTE — ED Notes (Signed)
Pt c/o chronic inner ear problems and for the past week she has had dizziness with nausea with increased problems with balance..states she lives alone and is afraid she is going to fall and get hurt..states she lives alone, had an apt at ENT but missed her apt today..Marland Kitchen

## 2015-04-29 NOTE — ED Provider Notes (Signed)
[facility] Provider [facility]  Time seen: 1:19 PM  I have reviewed the triage vital signs and the nursing notes.   HISTORY  Chief Complaint Dizziness and Nausea    HPI Latasha Beck is a 81 y.o. female with a past medical history of hypertension, vertigo presents the emergency department with 1 week of increased dizziness. According to the patient she has a history of vertigo and intermittently will have this dizziness/off-balance feeling. She says for the past one week and has been much worse, along with some nausea and mild headache. She states typically her vertigo responds very well to Antivert, but it has not helped this time. Of note the patient states she was recently diagnosed with atrial fibrillation approximately 1 month ago she is currently taking aspirin as her only anticoagulant. She has seen Dr.Fath regarding the same.Patient denies any chest pain, shortness breath, weakness or numbness of any arm or leg, confusion or slurred speech. She is here with a good friend of hers, who states she is acting normal/appropriate. Patient is very high functioning, lives alone. Describes her symptoms as moderate.     Past Medical History  Diagnosis Date  . Hypertension   . Vertigo   . Cataract     There are no active problems to display for this patient.   Past Surgical History  Procedure Laterality Date  . Appendectomy    . Cholecystectomy    . Abdominal hysterectomy    . Carpal tunnel release Right   . Cataract extraction Bilateral     No current outpatient prescriptions on file.  Allergies Review of patient's allergies indicates no known allergies.  No family history on file.  Social History History  Substance Use Topics  . Smoking status: Never Smoker   . Smokeless tobacco: Never Used  . Alcohol Use: No    Review of Systems Constitutional: Negative for fever. Cardiovascular: Negative for chest pain. Respiratory:  Negative for shortness of breath. Gastrointestinal: Negative for abdominal pain Genitourinary: Negative for dysuria. Neurological: Mild intermittent headache. 10-point ROS otherwise negative.  ____________________________________________   PHYSICAL EXAM:  VITAL SIGNS: ED Triage Vitals  Enc Vitals Group     BP 04/29/15 1145 145/80 mmHg     Pulse Rate 04/29/15 1145 76     Resp 04/29/15 1145 16     Temp 04/29/15 1145 98.3 F (36.8 C)     Temp Source 04/29/15 1145 Oral     SpO2 04/29/15 1145 96 %     Weight 04/29/15 1145 130 lb (58.968 kg)     Height 04/29/15 1145 5\' 5"  (1.651 m)     Head Cir --      Peak Flow --      Pain Score 04/29/15 1146 5     Pain Loc --      Pain Edu? --      Excl. in GC? --     Constitutional: Alert and oriented. Well appearing and in no distress. Eyes: Normal exam ENT   Head: Normocephalic and atraumatic.   Mouth/Throat: Mucous membranes are moist. Cardiovascular: Irregular rhythm, rate around 75 bpm. 3/6 systolic murmur. Respiratory: Normal respiratory effort without tachypnea nor retractions. Breath sounds are clear Gastrointestinal: Soft and nontender. No distention.   Musculoskeletal: Nontender with normal range of motion in all extremities.  Neurologic:  Normal speech and language. No gross focal neurologic deficits are appreciated. Speech is normal. No pronator drift. Intact finger to nose exam. No focal weakness identified. Skin:  Skin is warm, dry and intact.  Psychiatric: Mood and affect are normal. Speech and behavior are normal.   ____________________________________________    EKG EKG reviewed and interpreted by myself shows atrial fibrillation at 72 bpm, widened QRS, consistent with left bundle branch block. No ST changes meeting Sgarbosa criteria.  ____________________________________________    RADIOLOGY  CT does not show any acute abnormalities.  ____________________________________________   INITIAL IMPRESSION /  ASSESSMENT AND PLAN / ED COURSE  Pertinent labs & imaging results that were available during my care of the patient were reviewed by me and considered in my medical decision making (see chart for details).  Patient with worsening dizziness, but a history of chronic dizziness. Given her recent atrial fibrillation diagnosis, and the fact that the patient is only on aspirin for anticoagulation, we will proceed with a CT head to help further investigate. Overall the patient appears very well, with a very normal and well-appearing neurologic exam. Labs are largely within normal limits, urinalysis pending.   CTs show any acute abnormalities. Labs are largely within normal limits besides a mild dehydration. We will treat with normal saline, and meclizine. I discussed this with the patient who is our DE rescheduled her appointment with ENT for further evaluation.    ____________________________________________   FINAL CLINICAL IMPRESSION(S) / ED DIAGNOSES  dizziness    Minna Antis, MD 04/29/15 [phone]

## 2015-06-10 ENCOUNTER — Emergency Department: Payer: Medicare Other

## 2015-06-10 ENCOUNTER — Emergency Department
Admission: EM | Admit: 2015-06-10 | Discharge: 2015-06-10 | Disposition: A | Discharge status: Home / Self Care | Payer: Medicare Other | Attending: Emergency Medicine | Admitting: Emergency Medicine

## 2015-06-10 ENCOUNTER — Encounter: Payer: Self-pay | Admitting: *Deleted

## 2015-06-10 DIAGNOSIS — I10 Essential (primary) hypertension: Secondary | ICD-10-CM | POA: Diagnosis not present

## 2015-06-10 DIAGNOSIS — S79912A Unspecified injury of left hip, initial encounter: Secondary | ICD-10-CM | POA: Insufficient documentation

## 2015-06-10 DIAGNOSIS — R011 Cardiac murmur, unspecified: Secondary | ICD-10-CM | POA: Insufficient documentation

## 2015-06-10 DIAGNOSIS — Y998 Other external cause status: Secondary | ICD-10-CM | POA: Diagnosis not present

## 2015-06-10 DIAGNOSIS — S3993XA Unspecified injury of pelvis, initial encounter: Secondary | ICD-10-CM | POA: Diagnosis not present

## 2015-06-10 DIAGNOSIS — Y92009 Unspecified place in unspecified non-institutional (private) residence as the place of occurrence of the external cause: Secondary | ICD-10-CM | POA: Diagnosis not present

## 2015-06-10 DIAGNOSIS — Y9389 Activity, other specified: Secondary | ICD-10-CM | POA: Diagnosis not present

## 2015-06-10 DIAGNOSIS — R102 Pelvic and perineal pain: Secondary | ICD-10-CM

## 2015-06-10 DIAGNOSIS — W010XXA Fall on same level from slipping, tripping and stumbling without subsequent striking against object, initial encounter: Secondary | ICD-10-CM | POA: Insufficient documentation

## 2015-06-10 DIAGNOSIS — W19XXXA Unspecified fall, initial encounter: Secondary | ICD-10-CM

## 2015-06-10 DIAGNOSIS — S199XXA Unspecified injury of neck, initial encounter: Secondary | ICD-10-CM | POA: Diagnosis not present

## 2015-06-10 MED ORDER — TRAMADOL HCL 50 MG PO TABS: 50.0000 mg | ORAL_TABLET | Freq: Four times a day (QID) | ORAL | Status: AC | PRN

## 2015-06-10 MED ORDER — ACETAMINOPHEN 500 MG PO TABS
1000.0000 mg | ORAL_TABLET | Freq: Once | ORAL | Status: AC
  Administered 2015-06-10: 1000 mg via ORAL
  Filled 2015-06-10: qty 2

## 2015-06-10 NOTE — ED Notes (Signed)
Report received - pt in xr and will need medicated upon return

## 2015-06-10 NOTE — ED Provider Notes (Signed)
[facility] Emergency Department Provider Note     Time seen: ----------------------------------------- 10:38 AM on 06/10/2015 -----------------------------------------    I have reviewed the triage vital signs and the nursing notes.   HISTORY  Chief Complaint Fall    HPI Latasha Beck is a 81 y.o. female who presents ER after a fall at home. Patient called EMS, refused to come and then call back with left hip pain. She denies any other complaints other than recent left-sided cervical spine pain. Main complaint right now is left groin pain and hip pain with range of motion. Has had recent illness bout of congestive heart failure, otherwise denies complaints   Past Medical History  Diagnosis Date  . Hypertension   . Vertigo   . Cataract     There are no active problems to display for this patient.   Past Surgical History  Procedure Laterality Date  . Appendectomy    . Cholecystectomy    . Abdominal hysterectomy    . Carpal tunnel release Right   . Cataract extraction Bilateral     Allergies Review of patient's allergies indicates no known allergies.  Social History Social History  Substance Use Topics  . Smoking status: Never Smoker   . Smokeless tobacco: Never Used  . Alcohol Use: No    Review of Systems Constitutional: Negative for fever. Eyes: Negative for visual changes. ENT: Negative for sore throat. Cardiovascular: Negative for chest pain. Respiratory: Negative for shortness of breath. Gastrointestinal: Negative for abdominal pain, vomiting and diarrhea. Genitourinary: Negative for dysuria. Musculoskeletal: Positive for left-sided pelvic pain and cervical pain Skin: Negative for rash. Neurological: Negative for headaches, focal weakness or numbness.  10-point ROS otherwise negative.  ____________________________________________   PHYSICAL EXAM:  VITAL SIGNS: ED Triage Vitals  Enc Vitals Group     BP 06/10/15 1010  154/81 mmHg     Pulse Rate 06/10/15 1010 70     Resp 06/10/15 1010 16     Temp 06/10/15 1010 98.2 F (36.8 C)     Temp src --      SpO2 06/10/15 1010 98 %     Weight --      Height --      Head Cir --      Peak Flow --      Pain Score 06/10/15 1019 6     Pain Loc --      Pain Edu? --      Excl. in GC? --     Constitutional: Alert and oriented. Well appearing and in no distress. Eyes: Conjunctivae are normal. PERRL. Normal extraocular movements. ENT   Head: Normocephalic and atraumatic.   Nose: No congestion/rhinnorhea.   Mouth/Throat: Mucous membranes are moist.   Neck: No stridor. Cardiovascular: Normal rate, regular rhythm. Normal and symmetric distal pulses are present in all extremities. Harsh holosystolic murmur Respiratory: Normal respiratory effort without tachypnea nor retractions. Breath sounds are clear and equal bilaterally. No wheezes/rales/rhonchi. Gastrointestinal: Soft and nontender. No distention. No abdominal bruits.  Musculoskeletal: Left pelvic tenderness, pain with range of motion of left hip. Tenderness along the left lateral aspect of her lower cervical spine Neurologic:  Normal speech and language, very hard of hearing. No gross focal neurologic deficits are appreciated. Speech is normal.  Skin:  Skin is warm, dry and intact. No rash noted. Psychiatric: Mood and affect are normal.    ____________________________________________  ED COURSE:  Pertinent labs & imaging results that were available during my care of the  patient were reviewed by me and considered in my medical decision making (see chart for details). Patient will need imaging of the left hip and pelvis as well as C-spine ____________________________________________   RADIOLOGY Images were viewed by me  Left hip x-ray is unremarkable C-spine series is unremarkable  ____________________________________________  FINAL ASSESSMENT AND PLAN  Fall, hip pain  Plan: Patient with  labs and imaging as dictated above. There is a possibility of occult pelvic fracture, will be treated with pain medication and close follow-up with physical therapy.   Emily Filbert, MD   Emily Filbert, MD 06/10/15 [phone]

## 2015-06-10 NOTE — Discharge Instructions (Signed)
Fall Prevention and Home Safety Falls cause injuries and can affect all age groups. It is possible to use preventive measures to significantly decrease the likelihood of falls. There are many simple measures which can make your home safer and prevent falls. OUTDOORS  Repair cracks and edges of walkways and driveways.  Remove high doorway thresholds.  Trim shrubbery on the main path into your home.  Have good outside lighting.  Clear walkways of tools, rocks, debris, and clutter.  Check that handrails are not broken and are securely fastened. Both sides of steps should have handrails.  Have leaves, snow, and ice cleared regularly.  Use sand or salt on walkways during winter months.  In the garage, clean up grease or oil spills. BATHROOM  Install night lights.  Install grab bars by the toilet and in the tub and shower.  Use non-skid mats or decals in the tub or shower.  Place a plastic non-slip stool in the shower to sit on, if needed.  Keep floors dry and clean up all water on the floor immediately.  Remove soap buildup in the tub or shower on a regular basis.  Secure bath mats with non-slip, double-sided rug tape.  Remove throw rugs and tripping hazards from the floors. BEDROOMS  Install night lights.  Make sure a bedside light is easy to reach.  Do not use oversized bedding.  Keep a telephone by your bedside.  Have a firm chair with side arms to use for getting dressed.  Remove throw rugs and tripping hazards from the floor. KITCHEN  Keep handles on pots and pans turned toward the center of the stove. Use back burners when possible.  Clean up spills quickly and allow time for drying.  Avoid walking on wet floors.  Avoid hot utensils and knives.  Position shelves so they are not too high or low.  Place commonly used objects within easy reach.  If necessary, use a sturdy step stool with a grab bar when reaching.  Keep electrical cables out of the  way.  Do not use floor polish or wax that makes floors slippery. If you must use wax, use non-skid floor wax.  Remove throw rugs and tripping hazards from the floor. STAIRWAYS  Never leave objects on stairs.  Place handrails on both sides of stairways and use them. Fix any loose handrails. Make sure handrails on both sides of the stairways are as long as the stairs.  Check carpeting to make sure it is firmly attached along stairs. Make repairs to worn or loose carpet promptly.  Avoid placing throw rugs at the top or bottom of stairways, or properly secure the rug with carpet tape to prevent slippage. Get rid of throw rugs, if possible.  Have an electrician put in a light switch at the top and bottom of the stairs. OTHER FALL PREVENTION TIPS  Wear low-heel or rubber-soled shoes that are supportive and fit well. Wear closed toe shoes.  When using a stepladder, make sure it is fully opened and both spreaders are firmly locked. Do not climb a closed stepladder.  Add color or contrast paint or tape to grab bars and handrails in your home. Place contrasting color strips on first and last steps.  Learn and use mobility aids as needed. Install an electrical emergency response system.  Turn on lights to avoid dark areas. Replace light bulbs that burn out immediately. Get light switches that glow.  Arrange furniture to create clear pathways. Keep furniture in the same place.  Firmly attach carpet with non-skid or double-sided tape. °· Eliminate uneven floor surfaces. °· Select a carpet pattern that does not visually hide the edge of steps. °· Be aware of all pets. °OTHER HOME SAFETY TIPS °· Set the water temperature for 120° F (48.8° C). °· Keep emergency numbers on or near the telephone. °· Keep smoke detectors on every level of the home and near sleeping areas. °Document Released: 10/06/2002 Document Revised: 04/16/2012 Document Reviewed: 01/05/2012 °[employer]® Patient Information ©2015  [employer]. This information is not intended to replace advice given to you by your health care provider. Make sure you discuss any questions you have with your health care provider. ° °Stable Pelvic Fracture °You have one or more fractures (this means there is a break in the bones) of the pelvis. The pelvis is the ring of bones that make up your hipbones. These are the bones you sit on and the lower part of the spine. It is like a boney ring where your legs attach and which supports your upper body. You have an undisplaced fracture. This means the bones are in good position. The pelvic fracture you have is a simple (uncomplicated) fracture. °DIAGNOSIS  °X-rays usually diagnose these fractures. °TREATMENT  °The goal of treating pelvic fractures is to get the bones to heal in a good position. The patient should return to normal activities as soon as possible. Such fractures are often treated with normal bed rest and conservative measures.  °HOME CARE INSTRUCTIONS  °· You should be on bed rest for as long as directed by your caregiver. Change positions of your legs every 1-2 hours to maintain good blood flow. You may sit as long as is tolerable. Following this, you may do usual activities, but avoid strenuous activities for as long as directed by your caregiver. °· Only take over-the-counter or prescription medicines for pain, discomfort, or fever as directed by your caregiver. °· Bed rest may also be used for discomfort. °· Resume your activities when you are able. Use a cane or crutch on the injured side to reduce pain while walking, as needed. °· If you develop increased pain or discomfort not relieved with medications, contact your caregiver. °· Warning: Do not drive a car or operate a motor vehicle until your caregiver specifically tells you it is safe to do so. °SEEK IMMEDIATE MEDICAL CARE IF:  °· You feel light-headed or faint, develop chest pain or shortness of breath. °· An unexplained oral temperature  above 102° F (38.9° C) develops. °· You develop blood in the urine or in the stools. °· There is difficulty urinating, and/or having a bowel movement, or pain with these efforts. °· There is a difficulty or increased pain with walking. °· There is swelling in one or both legs that is not normal. °Document Released: 12/25/2001 Document Revised: 03/02/2014 Document Reviewed: 05/29/2008 °Exit[employer]® Patient Information ©2015 [employer]. This information is not intended to replace advice given to you by your health care provider. Make sure you discuss any questions you have with your health care provider. ° °

## 2015-06-10 NOTE — ED Notes (Signed)
Per EMS pt from home with c/o fall this am. Called EMS, refused to come in, then called back with left hip pain and wanting transport. VSS. Alert and oriented x 4.

## 2015-06-10 NOTE — ED Notes (Signed)
Patient transported to X-ray 

## 2015-09-07 IMAGING — CR DG CERVICAL SPINE COMPLETE 4+V
6 series · 6 of 6 positions shown · non-contrast
Comparison: None.

CLINICAL DATA: Status post fall

EXAM:
CERVICAL SPINE  4+ VIEWS

[c-spine lat]
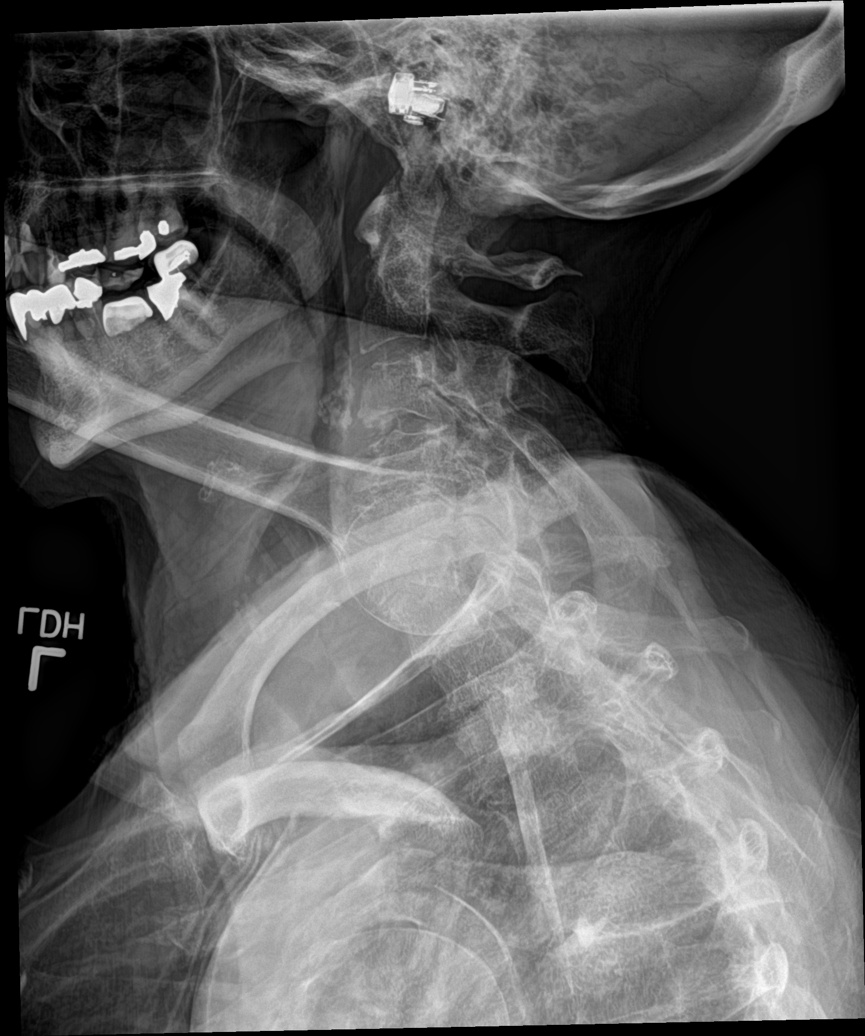

[c-spine obl (1 of 2)]
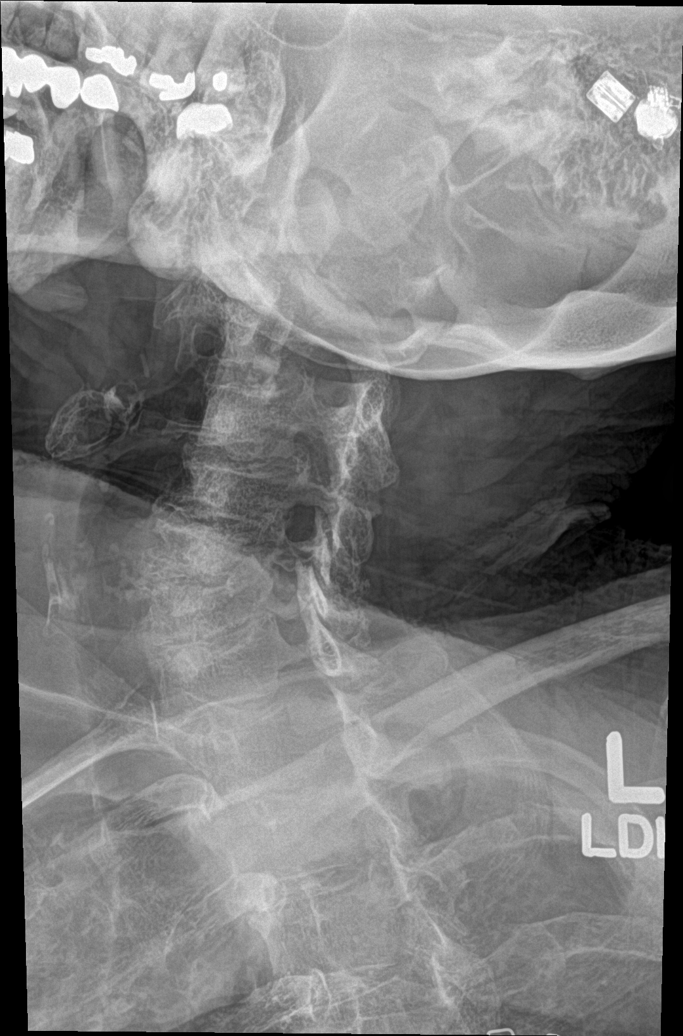

[c-spine obl (2 of 2)]
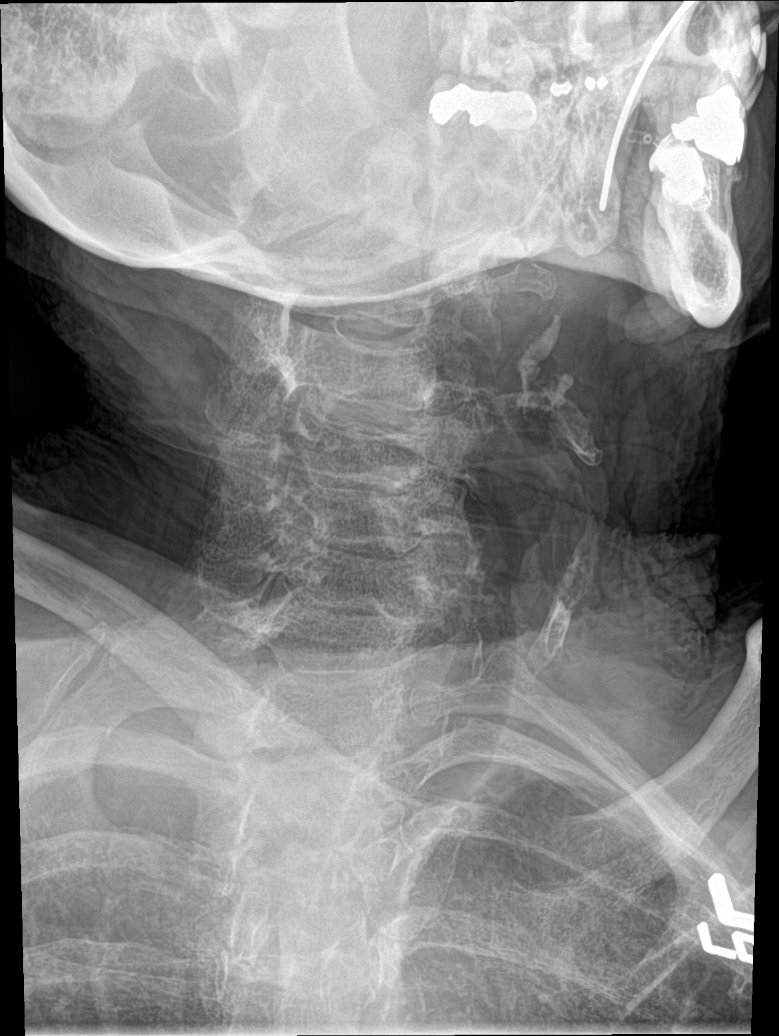

[c-spine ap]
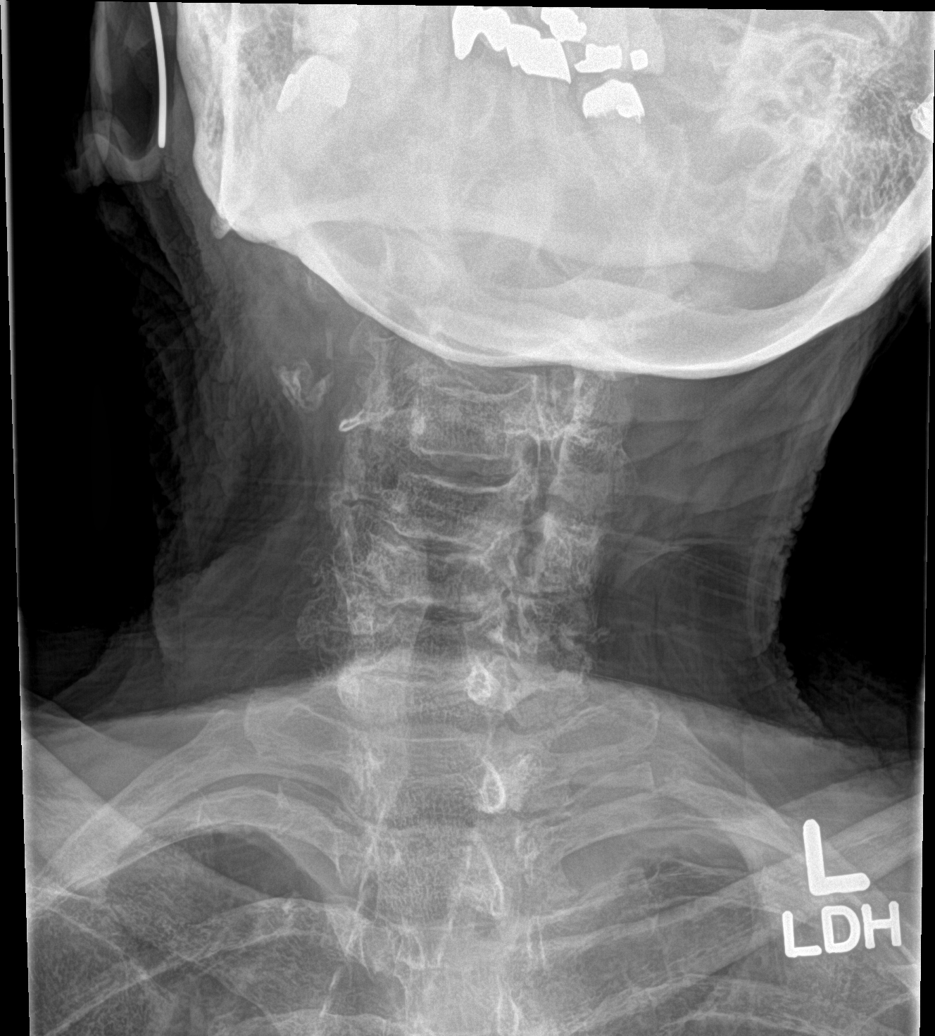

[c-spine open mouth]
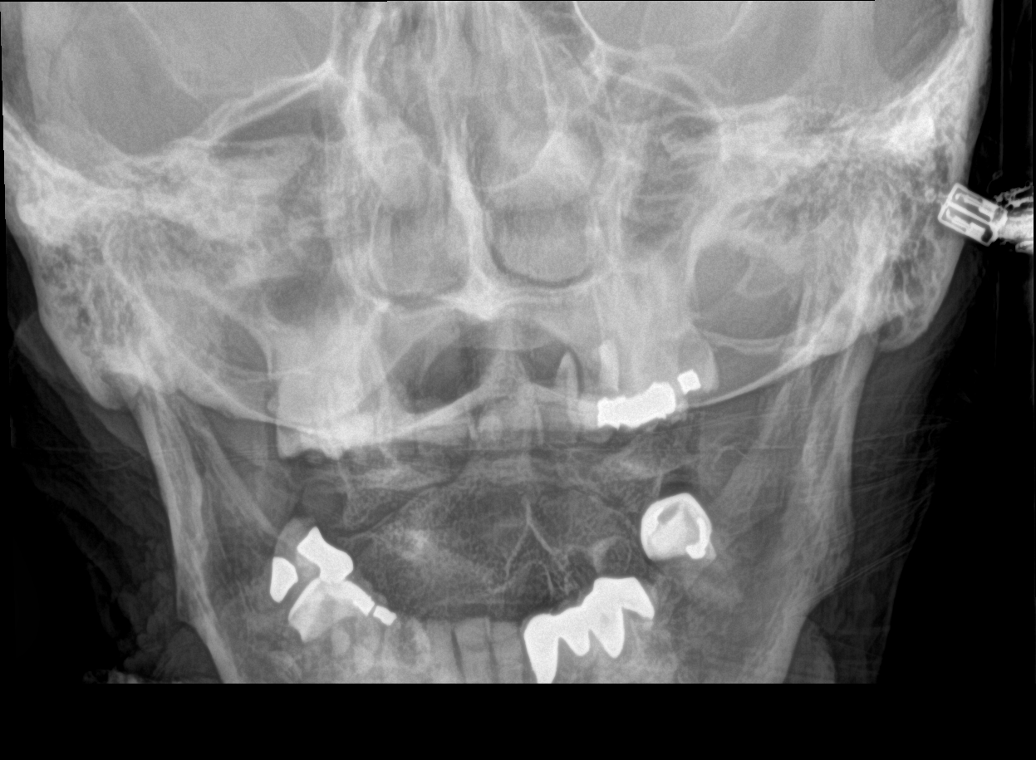

[c-spine swimmers]
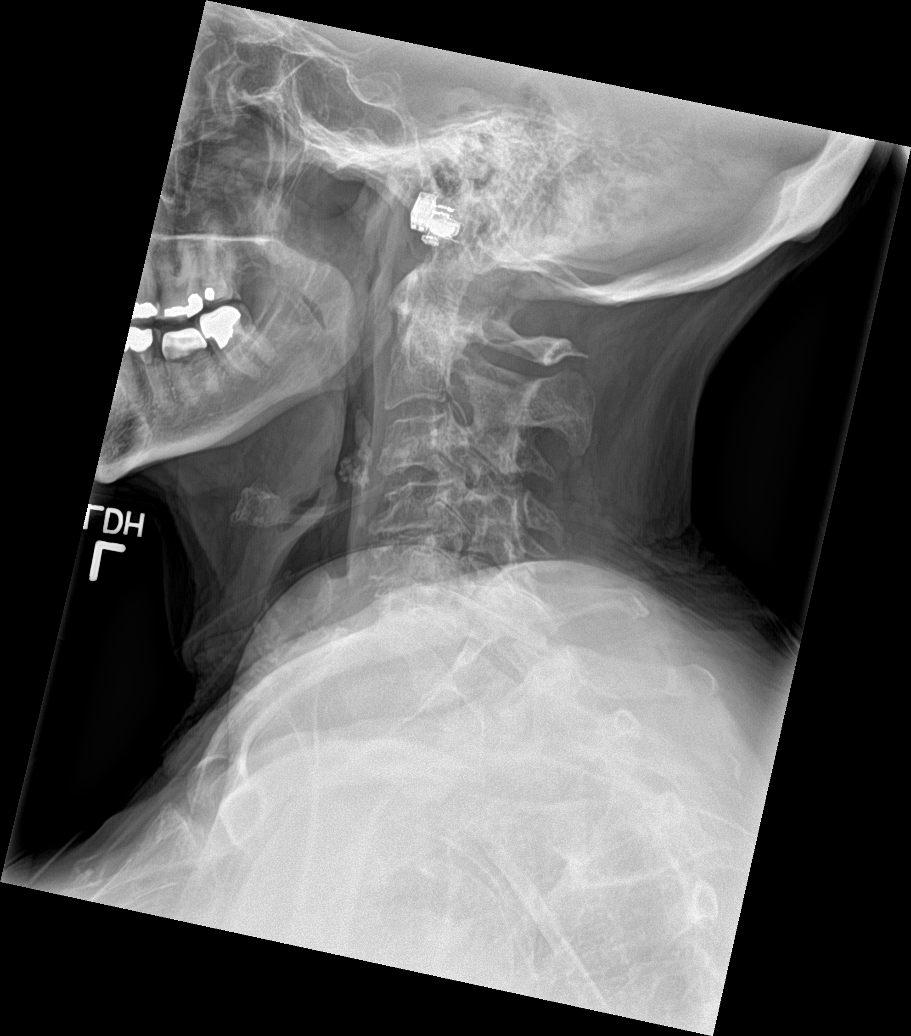

[6 of 6 positions shown; findings below may reference images not displayed]

FINDINGS: The cervical spine is visualized to the level of C4-5. There is
severe osteopenia.

The vertebral body heights are maintained. The alignment is normal.
The prevertebral soft tissues are normal. There is no acute fracture
or static listhesis. There is degenerative disc disease at C4-5 and
bilateral facet arthropathy.
IMPRESSION: Partially visualized cervical spine demonstrates no acute osseous
injury. If there is further clinical concern regarding injury,
recommend evaluation with a CT of the cervical spine given the
limited visualization, osteopenia and degenerative changes.

## 2015-11-05 ENCOUNTER — Ambulatory Visit: Payer: Self-pay | Admitting: Family Medicine

## 2016-07-20 ENCOUNTER — Emergency Department: Payer: Medicare Other

## 2016-07-20 ENCOUNTER — Inpatient Hospital Stay
Admission: EM | Admit: 2016-07-20 | Discharge: 2016-07-26 | DRG: 871 | Disposition: A | Discharge status: Skilled Nursing Facility | Payer: Medicare Other | Attending: Internal Medicine | Admitting: Internal Medicine

## 2016-07-20 ENCOUNTER — Encounter: Payer: Self-pay | Admitting: Emergency Medicine

## 2016-07-20 DIAGNOSIS — K219 Gastro-esophageal reflux disease without esophagitis: Secondary | ICD-10-CM | POA: Diagnosis present

## 2016-07-20 DIAGNOSIS — Z66 Do not resuscitate: Secondary | ICD-10-CM | POA: Diagnosis present

## 2016-07-20 DIAGNOSIS — R55 Syncope and collapse: Secondary | ICD-10-CM | POA: Diagnosis not present

## 2016-07-20 DIAGNOSIS — I420 Dilated cardiomyopathy: Secondary | ICD-10-CM | POA: Diagnosis present

## 2016-07-20 DIAGNOSIS — I509 Heart failure, unspecified: Secondary | ICD-10-CM

## 2016-07-20 DIAGNOSIS — Z515 Encounter for palliative care: Secondary | ICD-10-CM | POA: Diagnosis present

## 2016-07-20 DIAGNOSIS — Z8249 Family history of ischemic heart disease and other diseases of the circulatory system: Secondary | ICD-10-CM

## 2016-07-20 DIAGNOSIS — K5900 Constipation, unspecified: Secondary | ICD-10-CM | POA: Diagnosis not present

## 2016-07-20 DIAGNOSIS — I5023 Acute on chronic systolic (congestive) heart failure: Secondary | ICD-10-CM | POA: Diagnosis present

## 2016-07-20 DIAGNOSIS — I130 Hypertensive heart and chronic kidney disease with heart failure and stage 1 through stage 4 chronic kidney disease, or unspecified chronic kidney disease: Secondary | ICD-10-CM | POA: Diagnosis present

## 2016-07-20 DIAGNOSIS — I4891 Unspecified atrial fibrillation: Secondary | ICD-10-CM | POA: Diagnosis present

## 2016-07-20 DIAGNOSIS — N183 Chronic kidney disease, stage 3 (moderate): Secondary | ICD-10-CM | POA: Diagnosis present

## 2016-07-20 DIAGNOSIS — I482 Chronic atrial fibrillation: Secondary | ICD-10-CM | POA: Diagnosis present

## 2016-07-20 DIAGNOSIS — Z9109 Other allergy status, other than to drugs and biological substances: Secondary | ICD-10-CM

## 2016-07-20 DIAGNOSIS — Z886 Allergy status to analgesic agent status: Secondary | ICD-10-CM | POA: Diagnosis not present

## 2016-07-20 DIAGNOSIS — R410 Disorientation, unspecified: Secondary | ICD-10-CM | POA: Diagnosis present

## 2016-07-20 DIAGNOSIS — Z881 Allergy status to other antibiotic agents status: Secondary | ICD-10-CM | POA: Diagnosis not present

## 2016-07-20 DIAGNOSIS — J9600 Acute respiratory failure, unspecified whether with hypoxia or hypercapnia: Secondary | ICD-10-CM | POA: Diagnosis present

## 2016-07-20 DIAGNOSIS — F419 Anxiety disorder, unspecified: Secondary | ICD-10-CM | POA: Diagnosis present

## 2016-07-20 DIAGNOSIS — E039 Hypothyroidism, unspecified: Secondary | ICD-10-CM | POA: Diagnosis present

## 2016-07-20 DIAGNOSIS — J189 Pneumonia, unspecified organism: Secondary | ICD-10-CM | POA: Diagnosis present

## 2016-07-20 DIAGNOSIS — J441 Chronic obstructive pulmonary disease with (acute) exacerbation: Secondary | ICD-10-CM | POA: Diagnosis present

## 2016-07-20 DIAGNOSIS — M6281 Muscle weakness (generalized): Secondary | ICD-10-CM

## 2016-07-20 DIAGNOSIS — A419 Sepsis, unspecified organism: Secondary | ICD-10-CM | POA: Diagnosis present

## 2016-07-20 DIAGNOSIS — Z9981 Dependence on supplemental oxygen: Secondary | ICD-10-CM

## 2016-07-20 DIAGNOSIS — I10 Essential (primary) hypertension: Secondary | ICD-10-CM | POA: Diagnosis present

## 2016-07-20 DIAGNOSIS — I350 Nonrheumatic aortic (valve) stenosis: Secondary | ICD-10-CM | POA: Diagnosis present

## 2016-07-20 DIAGNOSIS — N179 Acute kidney failure, unspecified: Secondary | ICD-10-CM | POA: Diagnosis present

## 2016-07-20 DIAGNOSIS — Z882 Allergy status to sulfonamides status: Secondary | ICD-10-CM | POA: Diagnosis not present

## 2016-07-20 DIAGNOSIS — J449 Chronic obstructive pulmonary disease, unspecified: Secondary | ICD-10-CM | POA: Diagnosis present

## 2016-07-20 DIAGNOSIS — N189 Chronic kidney disease, unspecified: Secondary | ICD-10-CM

## 2016-07-20 HISTORY — DX: Nonrheumatic aortic (valve) stenosis: I35.0

## 2016-07-20 HISTORY — DX: Essential (primary) hypertension: I10

## 2016-07-20 HISTORY — DX: Chronic obstructive pulmonary disease, unspecified: J44.9

## 2016-07-20 HISTORY — DX: Hypothyroidism, unspecified: E03.9

## 2016-07-20 HISTORY — DX: Gastro-esophageal reflux disease without esophagitis: K21.9

## 2016-07-20 LAB — CBC WITH DIFFERENTIAL/PLATELET
BAND NEUTROPHILS: 3 %
BASOS ABS: 0 10*3/uL (ref 0–0.1)
Basophils Relative: 0 %
Blasts: 0 %
EOS ABS: 0 10*3/uL (ref 0–0.7)
EOS PCT: 0 %
HCT: 36.8 % (ref 35.0–47.0)
HEMOGLOBIN: 12.7 g/dL (ref 12.0–16.0)
LYMPHS ABS: 4.4 10*3/uL — AB (ref 1.0–3.6)
Lymphocytes Relative: 26 %
MCH: 31.4 pg (ref 26.0–34.0)
MCHC: 34.4 g/dL (ref 32.0–36.0)
MCV: 91.1 fL (ref 80.0–100.0)
METAMYELOCYTES PCT: 0 %
MONOS PCT: 3 %
MYELOCYTES: 1 %
Monocytes Absolute: 0.5 10*3/uL (ref 0.2–0.9)
NEUTROS ABS: 11.9 10*3/uL — AB (ref 1.4–6.5)
Neutrophils Relative %: 67 %
Other: 0 %
PLATELETS: 214 10*3/uL (ref 150–440)
Promyelocytes Absolute: 0 %
RBC: 4.03 MIL/uL (ref 3.80–5.20)
RDW: 14.3 % (ref 11.5–14.5)
WBC: 16.8 10*3/uL — AB (ref 3.6–11.0)
nRBC: 0 /100 WBC

## 2016-07-20 LAB — COMPREHENSIVE METABOLIC PANEL
ALBUMIN: 3.2 g/dL — AB (ref 3.5–5.0)
ALT: 28 U/L (ref 14–54)
AST: 63 U/L — AB (ref 15–41)
Alkaline Phosphatase: 71 U/L (ref 38–126)
Anion gap: 13 (ref 5–15)
BILIRUBIN TOTAL: 1.7 mg/dL — AB (ref 0.3–1.2)
BUN: 47 mg/dL — AB (ref 6–20)
CHLORIDE: 97 mmol/L — AB (ref 101–111)
CO2: 28 mmol/L (ref 22–32)
CREATININE: 2.17 mg/dL — AB (ref 0.44–1.00)
Calcium: 8.9 mg/dL (ref 8.9–10.3)
GFR calc Af Amer: 21 mL/min — ABNORMAL LOW (ref >60)
GFR, EST NON AFRICAN AMERICAN: 18 mL/min — AB (ref >60)
GLUCOSE: 129 mg/dL — AB (ref 65–99)
Potassium: 3.2 mmol/L — ABNORMAL LOW (ref 3.5–5.1)
Sodium: 138 mmol/L (ref 135–145)
TOTAL PROTEIN: 7.4 g/dL (ref 6.5–8.1)

## 2016-07-20 LAB — URINALYSIS COMPLETE WITH MICROSCOPIC (ARMC ONLY)
BILIRUBIN URINE: NEGATIVE
Bacteria, UA: NONE SEEN
Glucose, UA: NEGATIVE mg/dL
KETONES UR: NEGATIVE mg/dL
Nitrite: NEGATIVE
Protein, ur: 30 mg/dL — AB
Specific Gravity, Urine: 1.015 (ref 1.005–1.030)
pH: 5 (ref 5.0–8.0)

## 2016-07-20 LAB — TROPONIN I: TROPONIN I: 0.08 ng/mL — AB (ref <0.03)

## 2016-07-20 LAB — BRAIN NATRIURETIC PEPTIDE: B NATRIURETIC PEPTIDE 5: 483 pg/mL — AB (ref 0.0–100.0)

## 2016-07-20 LAB — LACTIC ACID, PLASMA: LACTIC ACID, VENOUS: 2.1 mmol/L — AB (ref 0.5–1.9)

## 2016-07-20 MED ORDER — TRAMADOL HCL 50 MG PO TABS
ORAL_TABLET | ORAL | Status: AC
  Administered 2016-07-20: 50 mg via ORAL
  Filled 2016-07-20: qty 1

## 2016-07-20 MED ORDER — HEPARIN SODIUM (PORCINE) 5000 UNIT/ML IJ SOLN
5000.0000 [IU] | Freq: Three times a day (TID) | INTRAMUSCULAR | Status: DC
  Administered 2016-07-21 – 2016-07-26 (×16): 5000 [IU] via SUBCUTANEOUS
  Filled 2016-07-20 (×16): qty 1

## 2016-07-20 MED ORDER — ASPIRIN 81 MG PO CHEW
81.0000 mg | CHEWABLE_TABLET | ORAL | Status: DC
  Administered 2016-07-21 – 2016-07-25 (×3): 81 mg via ORAL
  Filled 2016-07-20 (×3): qty 1

## 2016-07-20 MED ORDER — SODIUM CHLORIDE 0.9 % IV SOLN
INTRAVENOUS | Status: AC
  Administered 2016-07-20: 23:00:00 via INTRAVENOUS

## 2016-07-20 MED ORDER — ALBUTEROL SULFATE (2.5 MG/3ML) 0.083% IN NEBU
3.0000 mL | INHALATION_SOLUTION | RESPIRATORY_TRACT | Status: DC | PRN
  Administered 2016-07-21 – 2016-07-22 (×3): 3 mL via RESPIRATORY_TRACT
  Filled 2016-07-20 (×3): qty 3

## 2016-07-20 MED ORDER — VANCOMYCIN HCL IN DEXTROSE 750-5 MG/150ML-% IV SOLN
750.0000 mg | Freq: Once | INTRAVENOUS | Status: AC
  Administered 2016-07-21: 750 mg via INTRAVENOUS
  Filled 2016-07-20: qty 150

## 2016-07-20 MED ORDER — TRAMADOL HCL 50 MG PO TABS
50.0000 mg | ORAL_TABLET | Freq: Four times a day (QID) | ORAL | Status: DC | PRN
  Administered 2016-07-21: 50 mg via ORAL
  Filled 2016-07-20: qty 1

## 2016-07-20 MED ORDER — ONDANSETRON HCL 4 MG PO TABS: 4.0000 mg | ORAL_TABLET | Freq: Four times a day (QID) | ORAL | Status: DC | PRN

## 2016-07-20 MED ORDER — ACETAMINOPHEN 325 MG PO TABS
650.0000 mg | ORAL_TABLET | Freq: Four times a day (QID) | ORAL | Status: DC | PRN
  Administered 2016-07-25 – 2016-07-26 (×2): 650 mg via ORAL
  Filled 2016-07-20 (×2): qty 2

## 2016-07-20 MED ORDER — ESCITALOPRAM OXALATE 10 MG PO TABS
10.0000 mg | ORAL_TABLET | ORAL | Status: DC
  Administered 2016-07-21 – 2016-07-26 (×6): 10 mg via ORAL
  Filled 2016-07-20 (×6): qty 1

## 2016-07-20 MED ORDER — MONTELUKAST SODIUM 10 MG PO TABS
10.0000 mg | ORAL_TABLET | Freq: Every evening | ORAL | Status: DC
  Administered 2016-07-21 – 2016-07-25 (×5): 10 mg via ORAL
  Filled 2016-07-20 (×5): qty 1

## 2016-07-20 MED ORDER — GUAIFENESIN-DM 100-10 MG/5ML PO SYRP
5.0000 mL | ORAL_SOLUTION | ORAL | Status: DC | PRN
  Administered 2016-07-21 – 2016-07-23 (×2): 5 mL via ORAL
  Filled 2016-07-20 (×2): qty 5

## 2016-07-20 MED ORDER — IPRATROPIUM-ALBUTEROL 0.5-2.5 (3) MG/3ML IN SOLN
3.0000 mL | Freq: Once | RESPIRATORY_TRACT | Status: AC
  Administered 2016-07-20: 3 mL via RESPIRATORY_TRACT
  Filled 2016-07-20: qty 3

## 2016-07-20 MED ORDER — PIPERACILLIN-TAZOBACTAM 3.375 G IVPB
3.3750 g | Freq: Two times a day (BID) | INTRAVENOUS | Status: DC
  Administered 2016-07-21: 3.375 g via INTRAVENOUS
  Filled 2016-07-20: qty 50

## 2016-07-20 MED ORDER — TIOTROPIUM BROMIDE MONOHYDRATE 18 MCG IN CAPS
18.0000 ug | ORAL_CAPSULE | RESPIRATORY_TRACT | Status: DC
  Administered 2016-07-21 – 2016-07-26 (×6): 18 ug via RESPIRATORY_TRACT
  Filled 2016-07-20: qty 5

## 2016-07-20 MED ORDER — SODIUM CHLORIDE 0.9% FLUSH
3.0000 mL | Freq: Two times a day (BID) | INTRAVENOUS | Status: DC
  Administered 2016-07-21 – 2016-07-26 (×10): 3 mL via INTRAVENOUS

## 2016-07-20 MED ORDER — PREDNISONE 20 MG PO TABS
40.0000 mg | ORAL_TABLET | Freq: Every day | ORAL | Status: DC
  Filled 2016-07-20: qty 2

## 2016-07-20 MED ORDER — ACETAMINOPHEN 650 MG RE SUPP: 650.0000 mg | Freq: Four times a day (QID) | RECTAL | Status: DC | PRN

## 2016-07-20 MED ORDER — LEVOTHYROXINE SODIUM 75 MCG PO TABS
75.0000 ug | ORAL_TABLET | Freq: Every day | ORAL | Status: DC
  Administered 2016-07-21 – 2016-07-26 (×6): 75 ug via ORAL
  Filled 2016-07-20 (×6): qty 1

## 2016-07-20 MED ORDER — DIPHENHYDRAMINE HCL 25 MG PO CAPS: 25.0000 mg | ORAL_CAPSULE | Freq: Every evening | ORAL | Status: DC | PRN

## 2016-07-20 MED ORDER — ARFORMOTEROL TARTRATE 15 MCG/2ML IN NEBU
15.0000 ug | INHALATION_SOLUTION | Freq: Two times a day (BID) | RESPIRATORY_TRACT | Status: DC
  Administered 2016-07-21 – 2016-07-26 (×11): 15 ug via RESPIRATORY_TRACT
  Filled 2016-07-20 (×13): qty 2

## 2016-07-20 MED ORDER — VANCOMYCIN HCL IN DEXTROSE 750-5 MG/150ML-% IV SOLN
750.0000 mg | INTRAVENOUS | Status: DC
  Filled 2016-07-20: qty 150

## 2016-07-20 MED ORDER — ALBUTEROL SULFATE (2.5 MG/3ML) 0.083% IN NEBU: 2.5000 mg | INHALATION_SOLUTION | Freq: Four times a day (QID) | RESPIRATORY_TRACT | Status: DC | PRN

## 2016-07-20 MED ORDER — PIPERACILLIN-TAZOBACTAM 3.375 G IVPB 30 MIN
3.3750 g | Freq: Once | INTRAVENOUS | Status: AC
  Administered 2016-07-20: 3.375 g via INTRAVENOUS
  Filled 2016-07-20: qty 50

## 2016-07-20 MED ORDER — PANTOPRAZOLE SODIUM 40 MG PO TBEC
40.0000 mg | DELAYED_RELEASE_TABLET | ORAL | Status: DC
  Administered 2016-07-21 – 2016-07-26 (×6): 40 mg via ORAL
  Filled 2016-07-20 (×7): qty 1

## 2016-07-20 MED ORDER — ONDANSETRON HCL 4 MG/2ML IJ SOLN: 4.0000 mg | Freq: Four times a day (QID) | INTRAMUSCULAR | Status: DC | PRN

## 2016-07-20 MED ORDER — BUSPIRONE HCL 10 MG PO TABS
20.0000 mg | ORAL_TABLET | Freq: Two times a day (BID) | ORAL | Status: DC
  Administered 2016-07-21 – 2016-07-26 (×11): 20 mg via ORAL
  Filled 2016-07-20 (×14): qty 2

## 2016-07-20 MED ORDER — TRAMADOL HCL 50 MG PO TABS
50.0000 mg | ORAL_TABLET | Freq: Once | ORAL | Status: AC
  Administered 2016-07-20: 50 mg via ORAL

## 2016-07-20 NOTE — ED Notes (Signed)
Alphonse GuildJane Bowen, RN notified EDP of elevated troponin.

## 2016-07-20 NOTE — ED Notes (Signed)
EDP notified of elevated lactic 

## 2016-07-20 NOTE — ED Triage Notes (Signed)
Pt arrived via EMS from home for reports of increased SOB and cough with yellow tinged sputum. EMS reports VSS. SpO2 100% on 3L.

## 2016-07-20 NOTE — ED Notes (Signed)
Pt back from chest xray, daughter at bedside.  Pt offered toileting at this time, but refused.  Pt educated on using call button for any needs.  Verbalized understanding.  No other needs or concerns at this time.

## 2016-07-20 NOTE — ED Notes (Signed)
Per lab, new draw to be done for BNP.  New lavender top sent and lab reordered per instructions.

## 2016-07-20 NOTE — H&P (Signed)
[facility] - [place] at [facility]   PATIENT NAME: Latasha Beck    MR#:  [id number]  DATE OF BIRTH:  September 04, 1921  DATE OF ADMISSION:  07/20/2016  PRIMARY CARE PHYSICIAN: Marisue Ivan, MD   REQUESTING/REFERRING PHYSICIAN: Huel Cote, MD  CHIEF COMPLAINT:   Chief Complaint  Patient presents with  . Shortness of Breath    HISTORY OF PRESENT ILLNESS:  Latasha Beck  is a 82 y.o. female who presents with Shortness of breath and increasing productive cough over the last few days. Here in the ED she was found to meet sepsis criteria with tachycardia, elevated white count and lactic acidosis. Chest x-ray showed pneumonia. Hospitalists were called for admission and further treatment  PAST MEDICAL HISTORY:   Past Medical History:  Diagnosis Date  . Aortic stenosis   . Cataract   . COPD (chronic obstructive pulmonary disease) (HCC)   . GERD (gastroesophageal reflux disease)   . HTN (hypertension)   . Hypertension   . Hypothyroidism   . Vertigo     PAST SURGICAL HISTORY:   Past Surgical History:  Procedure Laterality Date  . ABDOMINAL HYSTERECTOMY    . APPENDECTOMY    . CARPAL TUNNEL RELEASE Right   . CATARACT EXTRACTION Bilateral   . CHOLECYSTECTOMY      SOCIAL HISTORY:   Social History  Substance Use Topics  . Smoking status: Never Smoker  . Smokeless tobacco: Never Used  . Alcohol use No    FAMILY HISTORY:   Family History  Problem Relation Age of Onset  . Heart disease Mother   . Stroke Father   . Diabetes Father   . Heart disease Brother     DRUG ALLERGIES:   Allergies  Allergen Reactions  . Amoxicillin Nausea Only    Has patient had a PCN reaction causing immediate rash, facial/tongue/throat swelling, SOB or lightheadedness with hypotension: No Has patient had a PCN reaction causing severe rash involving mucus membranes or skin necrosis: No Has patient had a PCN reaction that required hospitalization No Has patient had a PCN  reaction occurring within the last 10 years: Yes If all of the above answers are "NO", then may proceed with Cephalosporin use.  Marland Kitchen Amoxicillin-Pot Clavulanate     Other reaction(s): Other (See Comments) Gi upset  . Aspirin     Other reaction(s): Other (See Comments) In large doses - swelling of eyes  . Cephalexin     Other reaction(s): Other (See Comments) GI upset  . Ciprofloxacin     Other reaction(s): Other (See Comments) GI upset  . Hydrocodone Nausea Only  . Levofloxacin     Other reaction(s): Other (See Comments) GI upset  . Metoclopramide     Other reaction(s): Other (See Comments) Other Reaction: Not Assessed  . Nabumetone     Other reaction(s): Unknown  . Rosuvastatin     Other reaction(s): Muscle Pain  . Sulfa Antibiotics Other (See Comments)  . Budesonide-Formoterol Fumarate Palpitations    MEDICATIONS AT HOME:   Prior to Admission medications   Not on File    REVIEW OF SYSTEMS:  Review of Systems  Constitutional: Negative for chills, fever, malaise/fatigue and weight loss.  HENT: Negative for ear pain, hearing loss and tinnitus.   Eyes: Negative for blurred vision, double vision, pain and redness.  Respiratory: Positive for cough, sputum production and shortness of breath. Negative for hemoptysis.   Cardiovascular: Negative for chest pain, palpitations, orthopnea and leg swelling.  Gastrointestinal: Negative for abdominal pain,  constipation, diarrhea, nausea and vomiting.  Genitourinary: Negative for dysuria, frequency and hematuria.  Musculoskeletal: Negative for back pain, joint pain and neck pain.  Skin:       No acne, rash, or lesions  Neurological: Negative for dizziness, tremors, focal weakness and weakness.  Endo/Heme/Allergies: Negative for polydipsia. Does not bruise/bleed easily.  Psychiatric/Behavioral: Negative for depression. The patient is not nervous/anxious and does not have insomnia.      VITAL SIGNS:   Vitals:   07/20/16 1800  07/20/16 1802 07/20/16 1803 07/20/16 1914  BP: (!) 138/99 (!) 138/99  (!) 145/71  Pulse: 94 99  97  Resp: (!) 21 20  10   Temp:  98.6 F (37 C)    TempSrc:  Oral    SpO2: 99% 98%  99%  Weight:   59 kg (130 lb)   Height:   5' 5.5" (1.664 m)    Wt Readings from Last 3 Encounters:  07/20/16 59 kg (130 lb)  04/29/15 59 kg (130 lb)    PHYSICAL EXAMINATION:  Physical Exam  Vitals reviewed. Constitutional: She is oriented to person, place, and time. She appears well-developed and well-nourished. No distress.  HENT:  Head: Normocephalic and atraumatic.  Mouth/Throat: Oropharynx is clear and moist.  Eyes: Conjunctivae and EOM are normal. Pupils are equal, round, and reactive to light. No scleral icterus.  Neck: Normal range of motion. Neck supple. No JVD present. No thyromegaly present.  Cardiovascular: Normal rate and intact distal pulses.  Exam reveals no gallop and no friction rub.   Murmur (3/6 systolic) heard. Irregular rhythm  Respiratory: Effort normal. No respiratory distress. She has wheezes. She has no rales.  Coarse right basilar sounds and left sided wheezing  GI: Soft. Bowel sounds are normal. She exhibits no distension. There is no tenderness.  Musculoskeletal: Normal range of motion. She exhibits no edema.  No arthritis, no gout  Lymphadenopathy:    She has no cervical adenopathy.  Neurological: She is alert and oriented to person, place, and time. No cranial nerve deficit.  No dysarthria, no aphasia  Skin: Skin is warm and dry. No rash noted. No erythema.  Psychiatric: She has a normal mood and affect. Her behavior is normal. Judgment and thought content normal.    LABORATORY PANEL:   CBC  Recent Labs Lab 07/20/16 1807  WBC 16.8*  HGB 12.7  HCT 36.8  PLT 214   ------------------------------------------------------------------------------------------------------------------  Chemistries   Recent Labs Lab 07/20/16 1807  NA 138  K 3.2*  CL 97*  CO2 28   GLUCOSE 129*  BUN 47*  CREATININE 2.17*  CALCIUM 8.9  AST 63*  ALT 28  ALKPHOS 71  BILITOT 1.7*   ------------------------------------------------------------------------------------------------------------------  Cardiac Enzymes  Recent Labs Lab 07/20/16 1807  TROPONINI 0.08*   ------------------------------------------------------------------------------------------------------------------  RADIOLOGY:  Dg Chest 2 View  Result Date: 07/20/2016 CLINICAL DATA:  82 y/o F; increasing shortness of breath and productive cough for 2 days. EXAM: CHEST  2 VIEW COMPARISON:  01/16/2015 chest radiograph. FINDINGS: The patient is rotated. Stable cardiomediastinal silhouette given projection and technique. No acute osseous abnormality is identified. Right lung base patchy opacification may represent atelectasis or pneumonia. Interval resolution of right pleural effusion. Elevated left hemidiaphragm is stable. No pneumothorax. IMPRESSION: Right basilar patchy opacity may represent atelectasis or pneumonia. Electronically Signed   By: Mitzi Hansen M.D.   On: 07/20/2016 19:16    EKG:   Orders placed or performed during the hospital encounter of 07/20/16  . EKG 12-Lead  .  EKG 12-Lead    IMPRESSION AND PLAN:  Principal Problem:   Sepsis (HCC) - IV antibiotics continued from ED, cultures sent from ED, sepsis from CAP - see below, lactic acid high, IV fluids ordered and repeat lactate level, hemodynamically stable Active Problems:   CAP (community acquired pneumonia) - antibiotics and cultures as above   Acute on chronic renal failure (HCC) - from sepsis, IV fluids as above, avoid renal toxins   HTN (hypertension) - stable, cont home meds   COPD exacerbation (chronic obstructive pulmonary disease) (HCC) - cont home meds and inhalers, oral steroids given wheezing on exam   A-fib (HCC) - rate controlled, cont home meds   GERD (gastroesophageal reflux disease) - home dose ppi  All  the records are reviewed and case discussed with ED provider. Management plans discussed with the patient and/or family.  DVT PROPHYLAXIS: SubQ heparin  GI PROPHYLAXIS: PPI  ADMISSION STATUS: Inpatient  CODE STATUS: DNR Code Status History    This patient does not have a recorded code status. Please follow your organizational policy for patients in this situation.    Advance Directive Documentation   Flowsheet Row Most Recent Value  Type of Advance Directive  Healthcare Power of Attorney, Living will  Pre-existing out of facility DNR order (yellow form or pink MOST form)  No data  "MOST" Form in Place?  No data      TOTAL TIME TAKING CARE OF THIS PATIENT: 45 minutes.    Deamonte Sayegh FIELDING 07/20/2016, 8:41 PM  [facility]  Office  [phone]  CC: Primary care physician; Marisue IvanLINTHAVONG, KANHKA, MD

## 2016-07-20 NOTE — ED Provider Notes (Signed)
Time Seen: Approximately 1803  I have reviewed the triage notes  Chief Complaint: Shortness of Breath   History of Present Illness: Latasha Beck is a 82 y.o. female *who presents with increased shortness of breath and a productive cough over the past week. Increased shortness of breath. She denies any chest pain or trouble with speech or swallowing. Denies any nausea or vomiting. Patient is on 2 L nasal cannula at home on a regular basis. She is moved it to 3 L recently. She denies any leg pain or swelling   Past Medical History:  Diagnosis Date  . Cataract   . Hypertension   . Vertigo     There are no active problems to display for this patient.   Past Surgical History:  Procedure Laterality Date  . ABDOMINAL HYSTERECTOMY    . APPENDECTOMY    . CARPAL TUNNEL RELEASE Right   . CATARACT EXTRACTION Bilateral   . CHOLECYSTECTOMY      Past Surgical History:  Procedure Laterality Date  . ABDOMINAL HYSTERECTOMY    . APPENDECTOMY    . CARPAL TUNNEL RELEASE Right   . CATARACT EXTRACTION Bilateral   . CHOLECYSTECTOMY        Allergies:  Review of patient's allergies indicates no known allergies.  Family History: No family history on file.  Social History: Social History  Substance Use Topics  . Smoking status: Never Smoker  . Smokeless tobacco: Never Used  . Alcohol use No     Review of Systems:   10 point review of systems was performed and was otherwise negative:  Constitutional: No fever Eyes: No visual disturbances ENT: No sore throat, ear pain Cardiac: No chest pain Respiratory: Increased shortness of breath, without wheezing, or stridor Abdomen: No abdominal pain, no vomiting, No diarrhea Endocrine: No weight loss, No night sweats Extremities: No peripheral edema, cyanosis Skin: No rashes, easy bruising Neurologic: No focal weakness, trouble with speech or swollowing Urologic: No dysuria, Hematuria, or urinary frequency   Physical Exam:  ED  Triage Vitals  Enc Vitals Group     BP 07/20/16 1800 (!) 138/99     Pulse Rate 07/20/16 1800 94     Resp 07/20/16 1800 (!) 21     Temp 07/20/16 1802 98.6 F (37 C)     Temp Source 07/20/16 1802 Oral     SpO2 07/20/16 1800 99 %     Weight 07/20/16 1803 130 lb (59 kg)     Height 07/20/16 1803 5' 5.5" (1.664 m)     Head Circumference --      Peak Flow --      Pain Score --      Pain Loc --      Pain Edu? --      Excl. in GC? --     General: Awake , Alert , and Oriented times 3; GCS 15 No signs of respiratory distress Head: Normal cephalic , atraumatic Eyes: Pupils equal , round, reactive to light Nose/Throat: No nasal drainage, patent upper airway without erythema or exudate.  Neck: Supple, Full range of motion, No anterior adenopathy or palpable thyroid masses Lungs: Coarse breath sounds auscultated in the right upper and lower lobes  Heart: Regular rate, irregular rhythm without murmurs , gallops , or rubs Abdomen: Soft, non tender without rebound, guarding , or rigidity; bowel sounds positive and symmetric in all 4 quadrants. No organomegaly .        Extremities: 2 plus symmetric pulses. No edema,  clubbing or cyanosis Neurologic: normal ambulation, Motor symmetric without deficits, sensory intact Skin: warm, dry, no rashes   Labs:   All laboratory work was reviewed including any pertinent negatives or positives listed below:  Labs Reviewed  COMPREHENSIVE METABOLIC PANEL - Abnormal; Notable for the following:       Result Value   Potassium 3.2 (*)    Chloride 97 (*)    Glucose, Bld 129 (*)    BUN 47 (*)    Creatinine, Ser 2.17 (*)    Albumin 3.2 (*)    AST 63 (*)    Total Bilirubin 1.7 (*)    GFR calc non Af Amer 18 (*)    GFR calc Af Amer 21 (*)    All other components within normal limits  CBC WITH DIFFERENTIAL/PLATELET - Abnormal; Notable for the following:    WBC 16.8 (*)    Neutro Abs 11.9 (*)    Lymphs Abs 4.4 (*)    All other components within normal limits   TROPONIN I - Abnormal; Notable for the following:    Troponin I 0.08 (*)    All other components within normal limits  LACTIC ACID, PLASMA - Abnormal; Notable for the following:    Lactic Acid, Venous 2.1 (*)    All other components within normal limits  BRAIN NATRIURETIC PEPTIDE - Abnormal; Notable for the following:    B Natriuretic Peptide 483.0 (*)    All other components within normal limits  CULTURE, BLOOD (ROUTINE X 2)  CULTURE, BLOOD (ROUTINE X 2)  Lab work is significant for elevated troponin along with indications of renal insufficiency  EKG:  ED ECG REPORT I, Jennye MoccasinBrian S Milea Klink, the attending physician, personally viewed and interpreted this ECG.  Date: 07/20/2016 EKG Time: 1801 Rate: 99 Rhythm: Atrial fibrillation with occasional PACs QRS Axis: normal Intervals: normal ST/T Wave abnormalities: normal Conduction Disturbances: none Narrative Interpretation: unremarkable    Radiology:  "Dg Chest 2 View  Result Date: 07/20/2016 CLINICAL DATA:  82 y/o F; increasing shortness of breath and productive cough for 2 days. EXAM: CHEST  2 VIEW COMPARISON:  01/16/2015 chest radiograph. FINDINGS: The patient is rotated. Stable cardiomediastinal silhouette given projection and technique. No acute osseous abnormality is identified. Right lung base patchy opacification may represent atelectasis or pneumonia. Interval resolution of right pleural effusion. Elevated left hemidiaphragm is stable. No pneumothorax. IMPRESSION: Right basilar patchy opacity may represent atelectasis or pneumonia. Electronically Signed   By: Mitzi HansenLance  Furusawa-Stratton M.D.   On: 07/20/2016 19:16  "    I personally reviewed the radiologic studies     ED Course: Patient's otherwise hemodynamically stable. Her troponin is elevated and not sure of the significance as she denies any chest pain and has some obvious signs clinically of pneumonia with a persistent cough along with rhonchi on exam with a positive  chest x-ray. Patient has a only slightly elevated lactic acid level and I felt was unlikely that she was septic at this time especially given she is afebrile. Patient has blood cultures 2 and was started on IV antibiotics for community-acquired pneumonia Clinical Course     Assessment: * Community-acquired pneumonia Elevated troponin of unknown significance Oxygen dependent Renal insufficiency  Final Clinical Impression: *  Final diagnoses:  CAP (community acquired pneumonia)     Plan: * Inpatient            Jennye MoccasinBrian S Charizma Gardiner, MD 07/20/16 2025

## 2016-07-20 NOTE — Progress Notes (Signed)
Pharmacy Antibiotic Note  Latasha Beck is a 82 y.o. female admitted on 07/20/2016 with sepsis.  Pharmacy has been consulted for vancomycin and Zosyn dosing.  Plan: DW 57.7kg  Vd 40L kei 0.017 hr-1  T1/2 41 hours Vancomycin 750 mg q 48 hours ordered with stacked dosing. Level before 5th dose. Goal trough 15-20.    Zosyn 3.375 grams q 12 hours ordered.  Height: 5' 5.5" (166.4 cm) Weight: 127 lb 1.6 oz (57.7 kg) IBW/kg (Calculated) : 58.15  Temp (24hrs), Avg:98.8 F (37.1 C), Min:98.6 F (37 C), Max:99 F (37.2 C)   Recent Labs Lab 07/20/16 1807 07/20/16 1821  WBC 16.8*  --   CREATININE 2.17*  --   LATICACIDVEN  --  2.1*    Estimated Creatinine Clearance: 14.8 mL/min (by C-G formula based on SCr of 2.17 mg/dL (H)).    Allergies  Allergen Reactions  . Amoxicillin Nausea Only    Has patient had a PCN reaction causing immediate rash, facial/tongue/throat swelling, SOB or lightheadedness with hypotension: No Has patient had a PCN reaction causing severe rash involving mucus membranes or skin necrosis: No Has patient had a PCN reaction that required hospitalization No Has patient had a PCN reaction occurring within the last 10 years: Yes If all of the above answers are "NO", then may proceed with Cephalosporin use.  Marland Kitchen. Amoxicillin-Pot Clavulanate     Other reaction(s): Other (See Comments) Gi upset  . Aspirin     Other reaction(s): Other (See Comments) In large doses - swelling of eyes  . Cephalexin     Other reaction(s): Other (See Comments) GI upset  . Ciprofloxacin     Other reaction(s): Other (See Comments) GI upset  . Hydrocodone Nausea Only  . Levofloxacin     Other reaction(s): Other (See Comments) GI upset  . Metoclopramide     Other reaction(s): Other (See Comments) Other Reaction: Not Assessed  . Nabumetone     Other reaction(s): Unknown  . Rosuvastatin     Other reaction(s): Muscle Pain  . Sulfa Antibiotics Other (See Comments)  . Budesonide-Formoterol  Fumarate Palpitations    Antimicrobials this admission: vancomcyin  >>  Zosyn  >>   Dose adjustments this admission:   Microbiology results: 9/21 BCx: pending 9/21 UCx: pending     9.21 CXR: R basllar opacity, atelectasis vs. pneumonia 9/21 UA: pending  Thank you for allowing pharmacy to be a part of this patient's care.  Latasha Beck S 07/20/2016 10:53 PM

## 2016-07-21 ENCOUNTER — Inpatient Hospital Stay: Admit: 2016-07-21 | Payer: Medicare Other

## 2016-07-21 LAB — CBC
HCT: 35.4 % (ref 35.0–47.0)
Hemoglobin: 12 g/dL (ref 12.0–16.0)
MCH: 31.2 pg (ref 26.0–34.0)
MCHC: 33.9 g/dL (ref 32.0–36.0)
MCV: 92.1 fL (ref 80.0–100.0)
PLATELETS: 199 10*3/uL (ref 150–440)
RBC: 3.84 MIL/uL (ref 3.80–5.20)
RDW: 14.2 % (ref 11.5–14.5)
WBC: 16 10*3/uL — ABNORMAL HIGH (ref 3.6–11.0)

## 2016-07-21 LAB — BASIC METABOLIC PANEL
Anion gap: 12 (ref 5–15)
BUN: 39 mg/dL — AB (ref 6–20)
CHLORIDE: 100 mmol/L — AB (ref 101–111)
CO2: 28 mmol/L (ref 22–32)
CREATININE: 1.75 mg/dL — AB (ref 0.44–1.00)
Calcium: 8.3 mg/dL — ABNORMAL LOW (ref 8.9–10.3)
GFR calc Af Amer: 28 mL/min — ABNORMAL LOW (ref >60)
GFR calc non Af Amer: 24 mL/min — ABNORMAL LOW (ref >60)
GLUCOSE: 130 mg/dL — AB (ref 65–99)
Potassium: 2.7 mmol/L — CL (ref 3.5–5.1)
Sodium: 140 mmol/L (ref 135–145)

## 2016-07-21 LAB — GLUCOSE, CAPILLARY: Glucose-Capillary: 105 mg/dL — ABNORMAL HIGH (ref 65–99)

## 2016-07-21 LAB — MAGNESIUM: Magnesium: 1.8 mg/dL (ref 1.7–2.4)

## 2016-07-21 LAB — POTASSIUM: Potassium: 3.3 mmol/L — ABNORMAL LOW (ref 3.5–5.1)

## 2016-07-21 MED ORDER — MAGNESIUM SULFATE 2 GM/50ML IV SOLN
2.0000 g | Freq: Once | INTRAVENOUS | Status: AC
  Administered 2016-07-21: 2 g via INTRAVENOUS
  Filled 2016-07-21: qty 50

## 2016-07-21 MED ORDER — AZITHROMYCIN 250 MG PO TABS
250.0000 mg | ORAL_TABLET | Freq: Every day | ORAL | Status: DC
  Administered 2016-07-21 – 2016-07-25 (×5): 250 mg via ORAL
  Filled 2016-07-21 (×5): qty 1

## 2016-07-21 MED ORDER — LORAZEPAM 0.5 MG PO TABS
0.5000 mg | ORAL_TABLET | Freq: Once | ORAL | Status: AC
  Administered 2016-07-21: 0.5 mg via ORAL
  Filled 2016-07-21: qty 1

## 2016-07-21 MED ORDER — ORAL CARE MOUTH RINSE
15.0000 mL | Freq: Two times a day (BID) | OROMUCOSAL | Status: DC
  Administered 2016-07-21 – 2016-07-25 (×5): 15 mL via OROMUCOSAL

## 2016-07-21 MED ORDER — POTASSIUM CHLORIDE 20 MEQ PO PACK
40.0000 meq | PACK | Freq: Once | ORAL | Status: AC
  Administered 2016-07-21: 40 meq via ORAL
  Filled 2016-07-21: qty 2

## 2016-07-21 MED ORDER — POTASSIUM CHLORIDE 10 MEQ/100ML IV SOLN
10.0000 meq | INTRAVENOUS | Status: DC
Start: 2016-07-21 — End: 2016-07-21
  Filled 2016-07-21 (×3): qty 100

## 2016-07-21 MED ORDER — DEXTROSE 5 % IV SOLN
1.0000 g | INTRAVENOUS | Status: DC
  Administered 2016-07-21 – 2016-07-25 (×5): 1 g via INTRAVENOUS
  Filled 2016-07-21 (×6): qty 10

## 2016-07-21 MED ORDER — POTASSIUM CHLORIDE 20 MEQ PO PACK
20.0000 meq | PACK | Freq: Once | ORAL | Status: AC
  Administered 2016-07-21: 20 meq via ORAL
  Filled 2016-07-21: qty 1

## 2016-07-21 NOTE — Progress Notes (Addendum)
MEDICATION RELATED CONSULT NOTE - INITIAL   Pharmacy Consult for electrolyte replacement Indication: hypokalemia  Allergies  Allergen Reactions  . Amoxicillin Nausea Only    Has patient had a PCN reaction causing immediate rash, facial/tongue/throat swelling, SOB or lightheadedness with hypotension: No Has patient had a PCN reaction causing severe rash involving mucus membranes or skin necrosis: No Has patient had a PCN reaction that required hospitalization No Has patient had a PCN reaction occurring within the last 10 years: Yes If all of the above answers are "NO", then may proceed with Cephalosporin use.  Marland Kitchen. Amoxicillin-Pot Clavulanate     Other reaction(s): Other (See Comments) Gi upset  . Aspirin     Other reaction(s): Other (See Comments) In large doses - swelling of eyes  . Cephalexin     Other reaction(s): Other (See Comments) GI upset  . Ciprofloxacin     Other reaction(s): Other (See Comments) GI upset  . Hydrocodone Nausea Only  . Levofloxacin     Other reaction(s): Other (See Comments) GI upset  . Metoclopramide     Other reaction(s): Other (See Comments) Other Reaction: Not Assessed  . Nabumetone     Other reaction(s): Unknown  . Rosuvastatin     Other reaction(s): Muscle Pain  . Sulfa Antibiotics Other (See Comments)  . Budesonide-Formoterol Fumarate Palpitations    Patient Measurements: Height: 5' 5.5" (166.4 cm) Weight: 127 lb 1.6 oz (57.7 kg) IBW/kg (Calculated) : 58.15 Adjusted Body Weight:    Vital Signs: Temp: 98.6 F (37 C) (09/22 0900) Temp Source: Oral (09/22 0900) BP: 157/55 (09/22 0900) Pulse Rate: 90 (09/22 0900) Intake/Output from previous day: 09/21 0701 - 09/22 0700 In: 428.8 [I.V.:378.8; IV Piggyback:50] Out: 150 [Urine:150] Intake/Output from this shift: Total I/O In: -  Out: 200 [Urine:200]  Labs:  Recent Labs  07/20/16 1807 07/21/16 0609  WBC 16.8* 16.0*  HGB 12.7 12.0  HCT 36.8 35.4  PLT 214 199  CREATININE 2.17*  1.75*  ALBUMIN 3.2*  --   PROT 7.4  --   AST 63*  --   ALT 28  --   ALKPHOS 71  --   BILITOT 1.7*  --    Estimated Creatinine Clearance: 18.3 mL/min (by C-G formula based on SCr of 1.75 mg/dL (H)).   Microbiology: Recent Results (from the past 720 hour(s))  CULTURE, BLOOD (ROUTINE X 2) w Reflex to ID Panel     Status: None (Preliminary result)   Collection Time: 07/20/16  6:21 PM  Result Value Ref Range Status   Specimen Description BLOOD  LEFT AC  Final   Special Requests   Final    BOTTLES DRAWN AEROBIC AND ANAEROBIC  AER6CC ANA 8CC   Culture NO GROWTH < 12 HOURS  Final   Report Status PENDING  Incomplete  CULTURE, BLOOD (ROUTINE X 2) w Reflex to ID Panel     Status: None (Preliminary result)   Collection Time: 07/20/16  6:21 PM  Result Value Ref Range Status   Specimen Description BLOOD  LEFT WRIST  Final   Special Requests   Final    BOTTLES DRAWN AEROBIC AND ANAEROBIC  AER 10CC ANA9CC   Culture NO GROWTH < 12 HOURS  Final   Report Status PENDING  Incomplete    Medical History: Past Medical History:  Diagnosis Date  . Aortic stenosis   . Cataract   . COPD (chronic obstructive pulmonary disease) (HCC)   . GERD (gastroesophageal reflux disease)   . HTN (hypertension)   .  Hypertension   . Hypothyroidism   . Vertigo     Medications:  Scheduled:  . arformoterol  15 mcg Nebulization BID  . aspirin  81 mg Oral QODAY  . busPIRone  20 mg Oral BID  . escitalopram  10 mg Oral BH-q7a  . heparin  5,000 Units Subcutaneous Q8H  . levothyroxine  75 mcg Oral QAC breakfast  . mouth rinse  15 mL Mouth Rinse BID  . montelukast  10 mg Oral QPM  . pantoprazole  40 mg Oral BH-q7a  . piperacillin-tazobactam (ZOSYN)  IV  3.375 g Intravenous Q12H  . potassium chloride  10 mEq Intravenous Q1 Hr x 3  . predniSONE  40 mg Oral Q breakfast  . sodium chloride flush  3 mL Intravenous Q12H  . tiotropium  18 mcg Inhalation BH-q7a  . vancomycin  750 mg Intravenous Q48H     Assessment: 82yo female admitted for sepsis now with hypokalemia.   K=2.7 SCr=1.75, est. CrCl=18.3 ml/min  Goal of Therapy:  Maintain electrolytes within normal range  Plan:  Will need to replete cautiously due to poor renal function. Will order KCl IV x 3. Will recheck K level and Mag level at 18:00.  Clovia Cuff, PharmD, BCPS 07/21/2016 10:23 AM   Addendum: Dr. Allena Katz requests oral potassium packets, will change to KCl packet PO x 1  Garlon Hatchet, PharmD Clinical Pharmacist  07/21/2016 11:08 AM

## 2016-07-21 NOTE — Progress Notes (Signed)
MEDICATION RELATED CONSULT NOTE -follow up  Pharmacy Consult for electrolyte replacement Indication: hypokalemia  Allergies  Allergen Reactions  . Amoxicillin Nausea Only    Has patient had a PCN reaction causing immediate rash, facial/tongue/throat swelling, SOB or lightheadedness with hypotension: No Has patient had a PCN reaction causing severe rash involving mucus membranes or skin necrosis: No Has patient had a PCN reaction that required hospitalization No Has patient had a PCN reaction occurring within the last 10 years: Yes If all of the above answers are "NO", then may proceed with Cephalosporin use.  Marland Kitchen. Amoxicillin-Pot Clavulanate     Other reaction(s): Other (See Comments) Gi upset  . Aspirin     Other reaction(s): Other (See Comments) In large doses - swelling of eyes  . Cephalexin     Other reaction(s): Other (See Comments) GI upset  . Ciprofloxacin     Other reaction(s): Other (See Comments) GI upset  . Hydrocodone Nausea Only  . Levofloxacin     Other reaction(s): Other (See Comments) GI upset  . Metoclopramide     Other reaction(s): Other (See Comments) Other Reaction: Not Assessed  . Nabumetone     Other reaction(s): Unknown  . Rosuvastatin     Other reaction(s): Muscle Pain  . Sulfa Antibiotics Other (See Comments)  . Budesonide-Formoterol Fumarate Palpitations    Patient Measurements: Height: 5' 5.5" (166.4 cm) Weight: 127 lb 1.6 oz (57.7 kg) IBW/kg (Calculated) : 58.15 Adjusted Body Weight:    Vital Signs: Temp: 97.9 F (36.6 C) (09/22 1512) Temp Source: Oral (09/22 1512) BP: 141/73 (09/22 1512) Pulse Rate: 89 (09/22 1512) Intake/Output from previous day: 09/21 0701 - 09/22 0700 In: 428.8 [I.V.:378.8; IV Piggyback:50] Out: 150 [Urine:150] Intake/Output from this shift: No intake/output data recorded.  Labs:  Recent Labs  07/20/16 1807 07/21/16 0609 07/21/16 1749  WBC 16.8* 16.0*  --   HGB 12.7 12.0  --   HCT 36.8 35.4  --   PLT  214 199  --   CREATININE 2.17* 1.75*  --   MG  --   --  1.8  ALBUMIN 3.2*  --   --   PROT 7.4  --   --   AST 63*  --   --   ALT 28  --   --   ALKPHOS 71  --   --   BILITOT 1.7*  --   --    Estimated Creatinine Clearance: 18.3 mL/min (by C-G formula based on SCr of 1.75 mg/dL (H)).   Microbiology: Recent Results (from the past 720 hour(s))  CULTURE, BLOOD (ROUTINE X 2) w Reflex to ID Panel     Status: None (Preliminary result)   Collection Time: 07/20/16  6:21 PM  Result Value Ref Range Status   Specimen Description BLOOD  LEFT AC  Final   Special Requests   Final    BOTTLES DRAWN AEROBIC AND ANAEROBIC  AER6CC ANA 8CC   Culture NO GROWTH < 12 HOURS  Final   Report Status PENDING  Incomplete  CULTURE, BLOOD (ROUTINE X 2) w Reflex to ID Panel     Status: None (Preliminary result)   Collection Time: 07/20/16  6:21 PM  Result Value Ref Range Status   Specimen Description BLOOD  LEFT WRIST  Final   Special Requests   Final    BOTTLES DRAWN AEROBIC AND ANAEROBIC  AER 10CC ANA9CC   Culture NO GROWTH < 12 HOURS  Final   Report Status PENDING  Incomplete  Medical History: Past Medical History:  Diagnosis Date  . Aortic stenosis   . Cataract   . COPD (chronic obstructive pulmonary disease) (HCC)   . GERD (gastroesophageal reflux disease)   . HTN (hypertension)   . Hypertension   . Hypothyroidism   . Vertigo     Medications:  Scheduled:  . arformoterol  15 mcg Nebulization BID  . aspirin  81 mg Oral QODAY  . azithromycin  250 mg Oral Daily  . busPIRone  20 mg Oral BID  . cefTRIAXone (ROCEPHIN)  IV  1 g Intravenous Q24H  . escitalopram  10 mg Oral BH-q7a  . heparin  5,000 Units Subcutaneous Q8H  . levothyroxine  75 mcg Oral QAC breakfast  . magnesium sulfate 1 - 4 g bolus IVPB  2 g Intravenous Once  . mouth rinse  15 mL Mouth Rinse BID  . montelukast  10 mg Oral QPM  . pantoprazole  40 mg Oral BH-q7a  . potassium chloride  20 mEq Oral Once  . predniSONE  40 mg Oral Q  breakfast  . sodium chloride flush  3 mL Intravenous Q12H  . tiotropium  18 mcg Inhalation BH-q7a    Assessment: 82yo female admitted for sepsis now with hypokalemia.   K=2.7 SCr=1.75, est. CrCl=18.3 ml/min  K at 1749= 3.3,  Mag=1.8   Goal of Therapy:  Maintain electrolytes within normal range  Plan:  Will need to replete cautiously due to poor renal function. Will order KCl packet po x 1. Will order Magnesium sulfate 2 gram IV x 1.  Will recheck electrolytes in am.  Bari Mantis PharmD Clinical Pharmacist 07/21/2016

## 2016-07-21 NOTE — Care Management (Signed)
Attempted to assess patient. She was resting with eyes closed and would not wake to name called.

## 2016-07-21 NOTE — Progress Notes (Addendum)
[facility] [place] [facility]   PATIENT NAME: Latasha DeedsOpal Beck    MR#:  [id number]  DATE OF BIRTH:  14-Jun-1921  SUBJECTIVE:  Came in with sob and found to have pneumonia  REVIEW OF SYSTEMS:   Review of Systems  Constitutional: Negative for chills, fever and weight loss.  HENT: Negative for ear discharge, ear pain and nosebleeds.   Eyes: Negative for blurred vision, pain and discharge.  Respiratory: Positive for cough, shortness of breath and wheezing. Negative for sputum production and stridor.   Cardiovascular: Negative for chest pain, palpitations, orthopnea and PND.  Gastrointestinal: Negative for abdominal pain, diarrhea, nausea and vomiting.  Genitourinary: Negative for frequency and urgency.  Musculoskeletal: Negative for back pain and joint pain.  Neurological: Positive for weakness. Negative for sensory change, speech change and focal weakness.  Psychiatric/Behavioral: Negative for depression and hallucinations. The patient is not nervous/anxious.    Tolerating Diet:yes Tolerating PT: pending  DRUG ALLERGIES:   Allergies  Allergen Reactions  . Amoxicillin Nausea Only    Has patient had a PCN reaction causing immediate rash, facial/tongue/throat swelling, SOB or lightheadedness with hypotension: No Has patient had a PCN reaction causing severe rash involving mucus membranes or skin necrosis: No Has patient had a PCN reaction that required hospitalization No Has patient had a PCN reaction occurring within the last 10 years: Yes If all of the above answers are "NO", then may proceed with Cephalosporin use.  Marland Kitchen. Amoxicillin-Pot Clavulanate     Other reaction(s): Other (See Comments) Gi upset  . Aspirin     Other reaction(s): Other (See Comments) In large doses - swelling of eyes  . Cephalexin     Other reaction(s): Other (See Comments) GI upset  . Ciprofloxacin     Other reaction(s): Other (See Comments) GI upset  . Hydrocodone Nausea Only   . Levofloxacin     Other reaction(s): Other (See Comments) GI upset  . Metoclopramide     Other reaction(s): Other (See Comments) Other Reaction: Not Assessed  . Nabumetone     Other reaction(s): Unknown  . Rosuvastatin     Other reaction(s): Muscle Pain  . Sulfa Antibiotics Other (See Comments)  . Budesonide-Formoterol Fumarate Palpitations    VITALS:  Blood pressure 123/66, pulse 77, temperature 98.5 F (36.9 C), temperature source Oral, resp. rate 18, height 5' 5.5" (1.664 m), weight 57.7 kg (127 lb 1.6 oz), SpO2 98 %.  PHYSICAL EXAMINATION:   Physical Exam  GENERAL:  82 y.o.-year-old patient lying in the bed with no acute distress. thin EYES: Pupils equal, round, reactive to light and accommodation. No scleral icterus. Extraocular muscles intact.  HEENT: Head atraumatic, normocephalic. Oropharynx and nasopharynx clear.  NECK:  Supple, no jugular venous distention. No thyroid enlargement, no tenderness.  LUNGS: distant breath sounds bilaterally, no wheezing, rales, rhonchi. No use of accessory muscles of respiration. Coarse bs CARDIOVASCULAR: S1, S2 normal. No murmurs, rubs, or gallops.  ABDOMEN: Soft, nontender, nondistended. Bowel sounds present. No organomegaly or mass.  EXTREMITIES: No cyanosis, clubbing or edema b/l.    NEUROLOGIC: Cranial nerves II through XII are intact. No focal Motor or sensory deficits b/l.   PSYCHIATRIC:  patient is alert and oriented x 3.  SKIN: No obvious rash, lesion, or ulcer.   LABORATORY PANEL:  CBC  Recent Labs Lab 07/21/16 0609  WBC 16.0*  HGB 12.0  HCT 35.4  PLT 199    Chemistries   Recent Labs Lab 07/20/16 1807 07/21/16 [id number]  NA 138 140  K 3.2* 2.7*  CL 97* 100*  CO2 28 28  GLUCOSE 129* 130*  BUN 47* 39*  CREATININE 2.17* 1.75*  CALCIUM 8.9 8.3*  AST 63*  --   ALT 28  --   ALKPHOS 71  --   BILITOT 1.7*  --    Cardiac Enzymes  Recent Labs Lab 07/20/16 1807  TROPONINI 0.08*   RADIOLOGY:  Dg Chest 2  View  Result Date: 07/20/2016 CLINICAL DATA:  82 y/o F; increasing shortness of breath and productive cough for 2 days. EXAM: CHEST  2 VIEW COMPARISON:  01/16/2015 chest radiograph. FINDINGS: The patient is rotated. Stable cardiomediastinal silhouette given projection and technique. No acute osseous abnormality is identified. Right lung base patchy opacification may represent atelectasis or pneumonia. Interval resolution of right pleural effusion. Elevated left hemidiaphragm is stable. No pneumothorax. IMPRESSION: Right basilar patchy opacity may represent atelectasis or pneumonia. Electronically Signed   By: Mitzi Hansen M.D.   On: 07/20/2016 19:16   ASSESSMENT AND PLAN:  [name]  is a 82 y.o. female who presents with Shortness of breath and increasing productive cough over the last few days. Here in the ED she was found to meet sepsis criteria with tachycardia, elevated white count and lactic acidosis. Chest x-ray showed pneumonia  1.Sepsis (HCC) -Iv rocephin and zithromax -f/u cultures and WBC  2. CAP - see below, lactic acid high, IV fluids ordered and repeat lactate level, hemodynamically stable -cont iv abxs  3.  Acute on chronic renal failure (HCC) - from sepsis, IV fluids as above, avoid renal toxins -creat 2.17---1.7  4.  HTN (hypertension) - stable, cont home meds  5.  COPD exacerbation (chronic obstructive pulmonary disease) (HCC) - cont home meds and inhalers, oral steroids  Daily--tapering dose   6.A-fib (HCC) - rate controlled, cont home meds    7.GERD (gastroesophageal reflux disease) - home dose ppi  Case discussed with Care Management/Social Worker. Management plans discussed with the patient, family and they are in agreement.  CODE STATUS: dnr  DVT Prophylaxis: lovenox  TOTAL TIME TAKING CARE OF THIS PATIENT: 30 minutes.  >50% time spent on counselling and coordination of care  POSSIBLE D/C IN 1-2 DAYS, DEPENDING ON CLINICAL CONDITION.  Note:  This dictation was prepared with Dragon dictation along with smaller phrase technology. Any transcriptional errors that result from this process are unintentional.  Brookelynne Dimperio M.D on 07/21/2016 at 2:45 PM  Between 7am to 6pm - Pager - [phone]  After 6pm go to [email or web address] - password [email or web address] [facility]  [facility]  Office  [phone]  CC: Primary care physician; Marisue Ivan, MD

## 2016-07-21 NOTE — Care Management Important Message (Signed)
Important Message  Patient Details  Name: Melany GuernseyOpal P Ruby MRN: [id number] Date of Birth: 01-23-1921   Medicare Important Message Given:       Marily MemosLisa M Lavern Maslow, RN 07/21/2016, 9:26 AM

## 2016-07-22 ENCOUNTER — Inpatient Hospital Stay
Admit: 2016-07-22 | Discharge: 2016-07-22 | Disposition: A | Discharge status: Home / Self Care | Payer: Medicare Other | Attending: Internal Medicine | Admitting: Internal Medicine

## 2016-07-22 LAB — BASIC METABOLIC PANEL
Anion gap: 8 (ref 5–15)
BUN: 32 mg/dL — AB (ref 6–20)
CHLORIDE: 102 mmol/L (ref 101–111)
CO2: 30 mmol/L (ref 22–32)
Calcium: 8.4 mg/dL — ABNORMAL LOW (ref 8.9–10.3)
Creatinine, Ser: 1.33 mg/dL — ABNORMAL HIGH (ref 0.44–1.00)
GFR calc Af Amer: 39 mL/min — ABNORMAL LOW (ref >60)
GFR calc non Af Amer: 33 mL/min — ABNORMAL LOW (ref >60)
GLUCOSE: 172 mg/dL — AB (ref 65–99)
POTASSIUM: 3.2 mmol/L — AB (ref 3.5–5.1)
Sodium: 140 mmol/L (ref 135–145)

## 2016-07-22 LAB — URINE CULTURE: Culture: NO GROWTH

## 2016-07-22 MED ORDER — POTASSIUM CHLORIDE CRYS ER 20 MEQ PO TBCR: 40.0000 meq | EXTENDED_RELEASE_TABLET | Freq: Once | ORAL | Status: DC

## 2016-07-22 MED ORDER — LORAZEPAM 0.5 MG PO TABS
0.5000 mg | ORAL_TABLET | Freq: Four times a day (QID) | ORAL | Status: DC | PRN
  Administered 2016-07-22 – 2016-07-26 (×7): 0.5 mg via ORAL
  Filled 2016-07-22 (×7): qty 1

## 2016-07-22 MED ORDER — LORAZEPAM 2 MG/ML IJ SOLN
0.5000 mg | Freq: Once | INTRAMUSCULAR | Status: AC
  Administered 2016-07-22: 0.5 mg via INTRAVENOUS
  Filled 2016-07-22: qty 1

## 2016-07-22 MED ORDER — POTASSIUM CHLORIDE 20 MEQ PO PACK
20.0000 meq | PACK | ORAL | Status: AC
  Administered 2016-07-22: 20 meq via ORAL
  Filled 2016-07-22: qty 1

## 2016-07-22 MED ORDER — GUAIFENESIN ER 600 MG PO TB12
600.0000 mg | ORAL_TABLET | Freq: Two times a day (BID) | ORAL | Status: DC
  Administered 2016-07-22 – 2016-07-26 (×9): 600 mg via ORAL
  Filled 2016-07-22 (×9): qty 1

## 2016-07-22 MED ORDER — METHYLPREDNISOLONE SODIUM SUCC 125 MG IJ SOLR
60.0000 mg | Freq: Two times a day (BID) | INTRAMUSCULAR | Status: DC
  Administered 2016-07-22 – 2016-07-23 (×4): 60 mg via INTRAVENOUS
  Filled 2016-07-22 (×4): qty 2

## 2016-07-22 MED ORDER — ALBUTEROL SULFATE (2.5 MG/3ML) 0.083% IN NEBU
3.0000 mL | INHALATION_SOLUTION | RESPIRATORY_TRACT | Status: DC
  Administered 2016-07-22 – 2016-07-24 (×12): 3 mL via RESPIRATORY_TRACT
  Filled 2016-07-22 (×12): qty 3

## 2016-07-22 NOTE — Progress Notes (Signed)
Pt alert and orient this morning, refused her medication, pt some what agitated. Pt took medication. svn treatment given. Pt became again agitated in the afternoon, with family at bedside. Ativan given which was effective.

## 2016-07-22 NOTE — Progress Notes (Signed)
Pt became agitated, trying to get out of bed multiple times and pulling out telemetry leads. Pt has PRN order of Lorazepam 0.5mg  tablet for agitation however, with pt being impulsive, Clinical research associatewriter paged oncall MD Anne HahnWillis and made aware, ordered for one time dose of Ativan 0.5mg  IV, Will administer and continue to monitor.

## 2016-07-22 NOTE — Progress Notes (Signed)
MEDICATION RELATED CONSULT NOTE -follow up  Pharmacy Consult for electrolyte replacement Indication: hypokalemia   Patient Measurements: Height: 5' 5.5" (166.4 cm) Weight: 127 lb 1.6 oz (57.7 kg) IBW/kg (Calculated) : 58.15 Adjusted Body Weight:    Vital Signs: Temp: 98.6 F (37 C) (09/23 0750) Temp Source: Oral (09/23 0750) BP: 144/75 (09/23 0750) Pulse Rate: 95 (09/23 0750) Intake/Output from previous day: 09/22 0701 - 09/23 0700 In: 850 [P.O.:600; IV Piggyback:50] Out: 950 [Urine:950] Intake/Output from this shift: No intake/output data recorded.  Labs:  Recent Labs  07/20/16 1807 07/21/16 0609 07/21/16 1749 07/22/16 0421  WBC 16.8* 16.0*  --   --   HGB 12.7 12.0  --   --   HCT 36.8 35.4  --   --   PLT 214 199  --   --   CREATININE 2.17* 1.75*  --  1.33*  MG  --   --  1.8  --   ALBUMIN 3.2*  --   --   --   PROT 7.4  --   --   --   AST 63*  --   --   --   ALT 28  --   --   --   ALKPHOS 71  --   --   --   BILITOT 1.7*  --   --   --    Estimated Creatinine Clearance: 24.1 mL/min (by C-G formula based on SCr of 1.33 mg/dL (H)).   Microbiology: Recent Results (from the past 720 hour(s))  CULTURE, BLOOD (ROUTINE X 2) w Reflex to ID Panel     Status: None (Preliminary result)   Collection Time: 07/20/16  6:21 PM  Result Value Ref Range Status   Specimen Description BLOOD  LEFT AC  Final   Special Requests   Final    BOTTLES DRAWN AEROBIC AND ANAEROBIC  AER6CC ANA 8CC   Culture NO GROWTH 2 DAYS  Final   Report Status PENDING  Incomplete  CULTURE, BLOOD (ROUTINE X 2) w Reflex to ID Panel     Status: None (Preliminary result)   Collection Time: 07/20/16  6:21 PM  Result Value Ref Range Status   Specimen Description BLOOD  LEFT WRIST  Final   Special Requests   Final    BOTTLES DRAWN AEROBIC AND ANAEROBIC  AER 10CC ANA9CC   Culture NO GROWTH 2 DAYS  Final   Report Status PENDING  Incomplete    Medical History: Past Medical History:  Diagnosis Date  .  Aortic stenosis   . Cataract   . COPD (chronic obstructive pulmonary disease) (HCC)   . GERD (gastroesophageal reflux disease)   . HTN (hypertension)   . Hypertension   . Hypothyroidism   . Vertigo     Medications:  Scheduled:  . arformoterol  15 mcg Nebulization BID  . aspirin  81 mg Oral QODAY  . azithromycin  250 mg Oral Daily  . busPIRone  20 mg Oral BID  . cefTRIAXone (ROCEPHIN)  IV  1 g Intravenous Q24H  . escitalopram  10 mg Oral BH-q7a  . heparin  5,000 Units Subcutaneous Q8H  . levothyroxine  75 mcg Oral QAC breakfast  . mouth rinse  15 mL Mouth Rinse BID  . montelukast  10 mg Oral QPM  . pantoprazole  40 mg Oral BH-q7a  . potassium chloride  20 mEq Oral Q2H  . predniSONE  40 mg Oral Q breakfast  . sodium chloride flush  3 mL Intravenous Q12H  .  tiotropium  18 mcg Inhalation BH-q7a    Assessment: 82yo female admitted for sepsis now with hypokalemia.   9/22 AM K=2.7         SCr=1.75, est. CrCl=18.3 ml/min 9/22 PM K at 1749= 3.3,  Mag=1.8   9/23 AM K= 3.2  Goal of Therapy:  Maintain electrolytes within normal range  Plan:  Will need to replete cautiously due to poor renal function. Renal fxn improved 9/23.  Will order KCl packet po q2h x 3.  Will recheck electrolytes in am.  Bari Mantis PharmD Clinical Pharmacist 07/22/2016

## 2016-07-22 NOTE — Progress Notes (Signed)
[facility] - [place] at [facility]   PATIENT NAME: Latasha Beck    MR#:  [id number]  DATE OF BIRTH:  03/17/1921  SUBJECTIVE:  CHIEF COMPLAINT:   Chief Complaint  Patient presents with  . Shortness of Breath   - feels worse, on 3l o2 - rattling in chest, feels like she got worse  REVIEW OF SYSTEMS:  Review of Systems  Constitutional: Positive for malaise/fatigue. Negative for chills and fever.  HENT: Positive for hearing loss. Negative for ear discharge, ear pain and nosebleeds.   Eyes: Negative for blurred vision and double vision.  Respiratory: Positive for cough, shortness of breath and wheezing.   Cardiovascular: Negative for chest pain, palpitations and leg swelling.  Gastrointestinal: Negative for abdominal pain, constipation, diarrhea, nausea and vomiting.  Genitourinary: Negative for dysuria and urgency.  Musculoskeletal: Negative for myalgias.  Neurological: Negative for dizziness, sensory change, speech change, focal weakness, seizures and headaches.  Psychiatric/Behavioral: Negative for depression.    DRUG ALLERGIES:   Allergies  Allergen Reactions  . Amoxicillin Nausea Only    Has patient had a PCN reaction causing immediate rash, facial/tongue/throat swelling, SOB or lightheadedness with hypotension: No Has patient had a PCN reaction causing severe rash involving mucus membranes or skin necrosis: No Has patient had a PCN reaction that required hospitalization No Has patient had a PCN reaction occurring within the last 10 years: Yes If all of the above answers are "NO", then may proceed with Cephalosporin use.  Marland Kitchen Amoxicillin-Pot Clavulanate     Other reaction(s): Other (See Comments) Gi upset  . Aspirin     Other reaction(s): Other (See Comments) In large doses - swelling of eyes  . Cephalexin     Other reaction(s): Other (See Comments) GI upset  . Ciprofloxacin     Other reaction(s): Other (See Comments) GI upset  . Hydrocodone  Nausea Only  . Levofloxacin     Other reaction(s): Other (See Comments) GI upset  . Metoclopramide     Other reaction(s): Other (See Comments) Other Reaction: Not Assessed  . Nabumetone     Other reaction(s): Unknown  . Rosuvastatin     Other reaction(s): Muscle Pain  . Sulfa Antibiotics Other (See Comments)  . Budesonide-Formoterol Fumarate Palpitations    VITALS:  Blood pressure (!) 163/82, pulse 94, temperature 98.6 F (37 C), temperature source Oral, resp. rate 18, height 5' 5.5" (1.664 m), weight 57.7 kg (127 lb 1.6 oz), SpO2 94 %.  PHYSICAL EXAMINATION:  Physical Exam  GENERAL:  82 y.o.-year-old elderly patient lying in the bed with no acute distress.  EYES: Pupils equal, round, reactive to light and accommodation. No scleral icterus. Extraocular muscles intact.  HEENT: Head atraumatic, normocephalic. Oropharynx and nasopharynx clear.  NECK:  Supple, no jugular venous distention. No thyroid enlargement, no tenderness.  LUNGS: Diffuse expiratory wheezing, coarse rhonchi all over on exam. No use of accessory muscles of respiration.  CARDIOVASCULAR: S1, S2 normal. No  rubs, or gallops. 3/6 systolic murmur present ABDOMEN: Soft, nontender, nondistended. Bowel sounds present. No organomegaly or mass.  EXTREMITIES: No pedal edema, cyanosis, or clubbing.  NEUROLOGIC: Cranial nerves II through XII are intact. Muscle strength 5/5 in all extremities. Sensation intact. Gait not checked.  PSYCHIATRIC: The patient is alert and oriented x 3.  SKIN: No obvious rash, lesion, or ulcer.    LABORATORY PANEL:   CBC  Recent Labs Lab 07/21/16 0609  WBC 16.0*  HGB 12.0  HCT 35.4  PLT 199   ------------------------------------------------------------------------------------------------------------------  Chemistries   Recent Labs Lab 07/20/16 1807  07/21/16 1749 07/22/16 0421  NA 138  < >  --  140  K 3.2*  < > 3.3* 3.2*  CL 97*  < >  --  102  CO2 28  < >  --  30  GLUCOSE  129*  < >  --  172*  BUN 47*  < >  --  32*  CREATININE 2.17*  < >  --  1.33*  CALCIUM 8.9  < >  --  8.4*  MG  --   --  1.8  --   AST 63*  --   --   --   ALT 28  --   --   --   ALKPHOS 71  --   --   --   BILITOT 1.7*  --   --   --   < > = values in this interval not displayed. ------------------------------------------------------------------------------------------------------------------  Cardiac Enzymes  Recent Labs Lab 07/20/16 1807  TROPONINI 0.08*   ------------------------------------------------------------------------------------------------------------------  RADIOLOGY:  Dg Chest 2 View  Result Date: 07/20/2016 CLINICAL DATA:  82 y/o F; increasing shortness of breath and productive cough for 2 days. EXAM: CHEST  2 VIEW COMPARISON:  01/16/2015 chest radiograph. FINDINGS: The patient is rotated. Stable cardiomediastinal silhouette given projection and technique. No acute osseous abnormality is identified. Right lung base patchy opacification may represent atelectasis or pneumonia. Interval resolution of right pleural effusion. Elevated left hemidiaphragm is stable. No pneumothorax. IMPRESSION: Right basilar patchy opacity may represent atelectasis or pneumonia. Electronically Signed   By: Mitzi HansenLance  Furusawa-Stratton M.D.   On: 07/20/2016 19:16    EKG:   Orders placed or performed during the hospital encounter of 07/20/16  . EKG 12-Lead  . EKG 12-Lead    ASSESSMENT AND PLAN:   [name] a 82 y.o.femalewho presents with Shortness of breath and increasing productive cough over the last few days. Here in the ED she was found to meet sepsis criteria with tachycardia, elevated white count and lactic acidosis. Chest x-ray showed pneumonia  1.Sepsis (HCC)-secondary to pneumonia. Follow-up chest x-ray in a.m. -Iv rocephin and zithromax -f/u cultures and WBC -Flutter valve added and Mucinex  2. CAP - On antibiotics. Blood pressure is stable. -Cultures are  pending  3.Acute on chronic renal failure (HCC) - from sepsis -Improved with gentle hydration.  4.Anxiety-continue BuSpar. Also add Ativan as needed due to being on steroids  5.COPD exacerbation (chronic obstructive pulmonary disease) (HCC) - cont home meds and inhalers,  -Changed to IV Solu-Medrol due to worsening wheezing. Also nebs added scheduled  6.A-fib (HCC) - rate controlled, cont home meds Not on any calcium channel blockers or beta blockers  7.GERD (gastroesophageal reflux disease) - home dose ppi  8. DVT prophylaxis- on SQ heparin   All the records are reviewed and case discussed with Care Management/Social Workerr. Management plans discussed with the patient, family and they are in agreement.  CODE STATUS: full code   TOTAL TIME TAKING CARE OF THIS PATIENT: 38minutes.   POSSIBLE D/C IN 2 DAYS, DEPENDING ON CLINICAL CONDITION.   Enid BaasKALISETTI,Frimy Uffelman M.D on 07/22/2016 at 1:36 PM  Between 7am to 6pm - Pager - [phone]  After 6pm go to [email or web address] - Social research officer, governmentpassword [email or web address]  [facility] [place] [facility]  Office  [phone]  CC: Primary care physician; Marisue IvanLINTHAVONG, KANHKA, MD

## 2016-07-23 ENCOUNTER — Inpatient Hospital Stay: Payer: Medicare Other

## 2016-07-23 LAB — BASIC METABOLIC PANEL
ANION GAP: 8 (ref 5–15)
BUN: 29 mg/dL — AB (ref 6–20)
CHLORIDE: 100 mmol/L — AB (ref 101–111)
CO2: 31 mmol/L (ref 22–32)
Calcium: 8.9 mg/dL (ref 8.9–10.3)
Creatinine, Ser: 1.18 mg/dL — ABNORMAL HIGH (ref 0.44–1.00)
GFR calc Af Amer: 45 mL/min — ABNORMAL LOW (ref >60)
GFR calc non Af Amer: 39 mL/min — ABNORMAL LOW (ref >60)
GLUCOSE: 200 mg/dL — AB (ref 65–99)
POTASSIUM: 3.6 mmol/L (ref 3.5–5.1)
SODIUM: 139 mmol/L (ref 135–145)

## 2016-07-23 LAB — CBC
HEMATOCRIT: 33.8 % — AB (ref 35.0–47.0)
HEMOGLOBIN: 11.4 g/dL — AB (ref 12.0–16.0)
MCH: 31.3 pg (ref 26.0–34.0)
MCHC: 33.8 g/dL (ref 32.0–36.0)
MCV: 92.7 fL (ref 80.0–100.0)
Platelets: 244 10*3/uL (ref 150–440)
RBC: 3.65 MIL/uL — AB (ref 3.80–5.20)
RDW: 14.4 % (ref 11.5–14.5)
WBC: 15.3 10*3/uL — ABNORMAL HIGH (ref 3.6–11.0)

## 2016-07-23 LAB — ECHOCARDIOGRAM COMPLETE
Height: 65.5 in
WEIGHTICAEL: 2033.6 [oz_av]

## 2016-07-23 LAB — MAGNESIUM: MAGNESIUM: 2.2 mg/dL (ref 1.7–2.4)

## 2016-07-23 MED ORDER — LORAZEPAM 0.5 MG PO TABS
0.5000 mg | ORAL_TABLET | Freq: Once | ORAL | Status: AC
  Administered 2016-07-23: 0.5 mg via ORAL
  Filled 2016-07-23: qty 1

## 2016-07-23 MED ORDER — POTASSIUM CHLORIDE CRYS ER 20 MEQ PO TBCR
20.0000 meq | EXTENDED_RELEASE_TABLET | Freq: Every day | ORAL | Status: DC
  Administered 2016-07-23 – 2016-07-26 (×4): 20 meq via ORAL
  Filled 2016-07-23 (×4): qty 1

## 2016-07-23 MED ORDER — SENNOSIDES-DOCUSATE SODIUM 8.6-50 MG PO TABS
1.0000 | ORAL_TABLET | Freq: Two times a day (BID) | ORAL | Status: DC
  Administered 2016-07-23 – 2016-07-26 (×7): 1 via ORAL
  Filled 2016-07-23 (×7): qty 1

## 2016-07-23 MED ORDER — LISINOPRIL 5 MG PO TABS
5.0000 mg | ORAL_TABLET | Freq: Every day | ORAL | Status: DC
  Administered 2016-07-23 – 2016-07-26 (×4): 5 mg via ORAL
  Filled 2016-07-23 (×4): qty 1

## 2016-07-23 MED ORDER — FUROSEMIDE 40 MG PO TABS
40.0000 mg | ORAL_TABLET | Freq: Every day | ORAL | Status: DC
Start: 2016-07-23 — End: 2016-07-26
  Administered 2016-07-23 – 2016-07-26 (×4): 40 mg via ORAL
  Filled 2016-07-23 (×4): qty 1

## 2016-07-23 MED ORDER — FUROSEMIDE 10 MG/ML IJ SOLN
40.0000 mg | INTRAMUSCULAR | Status: AC
  Administered 2016-07-23: 40 mg via INTRAVENOUS
  Filled 2016-07-23: qty 4

## 2016-07-23 MED ORDER — CARVEDILOL 6.25 MG PO TABS
6.2500 mg | ORAL_TABLET | Freq: Two times a day (BID) | ORAL | Status: DC
  Administered 2016-07-23 – 2016-07-25 (×5): 6.25 mg via ORAL
  Filled 2016-07-23 (×6): qty 1

## 2016-07-23 NOTE — Progress Notes (Signed)
Patient displaying increasing anxiety MD notified. Orders received.

## 2016-07-23 NOTE — Progress Notes (Signed)
MEDICATION RELATED CONSULT NOTE -follow up  Pharmacy Consult for electrolyte replacement Indication: hypokalemia   Patient Measurements: Height: 5' 5.5" (166.4 cm) Weight: 127 lb 1.6 oz (57.7 kg) IBW/kg (Calculated) : 58.15 Adjusted Body Weight:    Vital Signs: Temp: 97.7 F (36.5 C) (09/24 0912) Temp Source: Axillary (09/24 0912) BP: 154/76 (09/24 0912) Pulse Rate: 89 (09/24 0912) Intake/Output from previous day: 09/23 0701 - 09/24 0700 In: 890 [P.O.:840; IV Piggyback:50] Out: 550 [Urine:550] Intake/Output from this shift: No intake/output data recorded.  Labs:  Recent Labs  07/20/16 1807 07/21/16 0609 07/21/16 1749 07/22/16 0421 07/23/16 0415  WBC 16.8* 16.0*  --   --  15.3*  HGB 12.7 12.0  --   --  11.4*  HCT 36.8 35.4  --   --  33.8*  PLT 214 199  --   --  244  CREATININE 2.17* 1.75*  --  1.33* 1.18*  MG  --   --  1.8  --  2.2  ALBUMIN 3.2*  --   --   --   --   PROT 7.4  --   --   --   --   AST 63*  --   --   --   --   ALT 28  --   --   --   --   ALKPHOS 71  --   --   --   --   BILITOT 1.7*  --   --   --   --    Estimated Creatinine Clearance: 27.1 mL/min (by C-G formula based on SCr of 1.18 mg/dL (H)).   Medical History: Past Medical History:  Diagnosis Date  . Aortic stenosis   . Cataract   . COPD (chronic obstructive pulmonary disease) (HCC)   . GERD (gastroesophageal reflux disease)   . HTN (hypertension)   . Hypertension   . Hypothyroidism   . Vertigo     Medications:  Scheduled:  . albuterol  3 mL Inhalation Q4H  . arformoterol  15 mcg Nebulization BID  . aspirin  81 mg Oral QODAY  . azithromycin  250 mg Oral Daily  . busPIRone  20 mg Oral BID  . cefTRIAXone (ROCEPHIN)  IV  1 g Intravenous Q24H  . escitalopram  10 mg Oral BH-q7a  . guaiFENesin  600 mg Oral BID  . heparin  5,000 Units Subcutaneous Q8H  . levothyroxine  75 mcg Oral QAC breakfast  . mouth rinse  15 mL Mouth Rinse BID  . methylPREDNISolone (SOLU-MEDROL) injection  60  mg Intravenous Q12H  . montelukast  10 mg Oral QPM  . pantoprazole  40 mg Oral BH-q7a  . sodium chloride flush  3 mL Intravenous Q12H  . tiotropium  18 mcg Inhalation BH-q7a    Assessment: 82yo female admitted for sepsis now with hypokalemia.   9/24 AM K= 3.6,  Mag= 2.2  Goal of Therapy:  Maintain electrolytes within normal range  Plan:  Will need to replete cautiously due to poor renal function. Renal fxn improved 9/24. Electrolytes WNL.  Will recheck electrolytes in am.  Bari MantisKristin Waunetta Riggle PharmD Clinical Pharmacist 07/23/2016

## 2016-07-23 NOTE — Progress Notes (Signed)
[facility] [place] [facility]   PATIENT NAME: Sarina Robleto    MR#:  [id number]  DATE OF BIRTH:  Nov 19, 1920  SUBJECTIVE:  CHIEF COMPLAINT:   Chief Complaint  Patient presents with  . Shortness of Breath   - Confused today, complains of constipation - still wheezing, but better than yesterday  REVIEW OF SYSTEMS:  Review of Systems  Constitutional: Positive for malaise/fatigue. Negative for chills and fever.  HENT: Positive for hearing loss. Negative for ear discharge, ear pain and nosebleeds.   Eyes: Negative for blurred vision and double vision.  Respiratory: Positive for cough, shortness of breath and wheezing.   Cardiovascular: Negative for chest pain, palpitations and leg swelling.  Gastrointestinal: Positive for constipation. Negative for abdominal pain, diarrhea, nausea and vomiting.  Genitourinary: Negative for dysuria and urgency.  Musculoskeletal: Negative for myalgias.  Neurological: Negative for dizziness, sensory change, speech change, focal weakness, seizures and headaches.  Psychiatric/Behavioral: Negative for depression.    DRUG ALLERGIES:   Allergies  Allergen Reactions  . Amoxicillin Nausea Only    Has patient had a PCN reaction causing immediate rash, facial/tongue/throat swelling, SOB or lightheadedness with hypotension: No Has patient had a PCN reaction causing severe rash involving mucus membranes or skin necrosis: No Has patient had a PCN reaction that required hospitalization No Has patient had a PCN reaction occurring within the last 10 years: Yes If all of the above answers are "NO", then may proceed with Cephalosporin use.  Marland Kitchen Amoxicillin-Pot Clavulanate     Other reaction(s): Other (See Comments) Gi upset  . Aspirin     Other reaction(s): Other (See Comments) In large doses - swelling of eyes  . Cephalexin     Other reaction(s): Other (See Comments) GI upset  . Ciprofloxacin     Other reaction(s): Other (See  Comments) GI upset  . Hydrocodone Nausea Only  . Levofloxacin     Other reaction(s): Other (See Comments) GI upset  . Metoclopramide     Other reaction(s): Other (See Comments) Other Reaction: Not Assessed  . Nabumetone     Other reaction(s): Unknown  . Rosuvastatin     Other reaction(s): Muscle Pain  . Sulfa Antibiotics Other (See Comments)  . Budesonide-Formoterol Fumarate Palpitations    VITALS:  Blood pressure (!) 156/88, pulse 90, temperature 98.2 F (36.8 C), temperature source Axillary, resp. rate 18, height 5' 5.5" (1.664 m), weight 57.7 kg (127 lb 1.6 oz), SpO2 100 %.  PHYSICAL EXAMINATION:  Physical Exam  GENERAL:  82 y.o.-year-old elderly patient lying in the bed with no acute distress.  EYES: Pupils equal, round, reactive to light and accommodation. No scleral icterus. Extraocular muscles intact.  HEENT: Head atraumatic, normocephalic. Oropharynx and nasopharynx clear.  NECK:  Supple, no jugular venous distention. No thyroid enlargement, no tenderness.  LUNGS: Diffuse expiratory wheezing- less intense than yesterday, coarse rhonchi all over on exam. No use of accessory muscles of respiration.  CARDIOVASCULAR: S1, S2 normal. No  rubs, or gallops. Harsh 3/6 systolic murmur present ABDOMEN: Soft, nontender, nondistended. Bowel sounds present. No organomegaly or mass.  EXTREMITIES: No pedal edema, cyanosis, or clubbing.  NEUROLOGIC: Cranial nerves II through XII are intact. Muscle strength 5/5 in all extremities. Sensation intact. Gait not checked.  PSYCHIATRIC: The patient is alert and oriented x 2. Intermittent confusion noted.  SKIN: No obvious rash, lesion, or ulcer.    LABORATORY PANEL:   CBC  Recent Labs Lab 07/23/16 0415  WBC 15.3*  HGB 11.4*  HCT 33.8*  PLT 244   ------------------------------------------------------------------------------------------------------------------  Chemistries   Recent Labs Lab 07/20/16 1807  07/23/16 0415  NA 138   < > 139  K 3.2*  < > 3.6  CL 97*  < > 100*  CO2 28  < > 31  GLUCOSE 129*  < > 200*  BUN 47*  < > 29*  CREATININE 2.17*  < > 1.18*  CALCIUM 8.9  < > 8.9  MG  --   < > 2.2  AST 63*  --   --   ALT 28  --   --   ALKPHOS 71  --   --   BILITOT 1.7*  --   --   < > = values in this interval not displayed. ------------------------------------------------------------------------------------------------------------------  Cardiac Enzymes  Recent Labs Lab 07/20/16 1807  TROPONINI 0.08*   ------------------------------------------------------------------------------------------------------------------  RADIOLOGY:  Dg Chest 2 View  Result Date: 07/23/2016 CLINICAL DATA:  Pneumonia. Shortness of breath. COPD. Hypertension. EXAM: CHEST  2 VIEW COMPARISON:  07/20/2016 FINDINGS: Osteopenia. Patient rotated right. Mild cardiomegaly. Probable trace bilateral pleural effusions. No pneumothorax. Increased pulmonary interstitial thickening, suggesting venous congestion. Persistent right base airspace disease. Mild subsegmental atelectasis in the left lung base. IMPRESSION: Similar right base airspace disease, favor infection or aspiration. Suspect developing pulmonary venous congestion. Electronically Signed   By: Jeronimo GreavesKyle  Talbot M.D.   On: 07/23/2016 09:56    EKG:   Orders placed or performed during the hospital encounter of 07/20/16  . EKG 12-Lead  . EKG 12-Lead    ASSESSMENT AND PLAN:   [name] a 82 y.o.femalewho presents with Shortness of breath and increasing productive cough over the last few days. Here in the ED she was found to meet sepsis criteria with tachycardia, elevated white count and lactic acidosis. Chest x-ray showed pneumonia  1.Sepsis (HCC)-secondary to pneumonia.  -Iv rocephin and zithromax -negative cultures and  Improving WBC -Flutter valve added and Mucinex  2. CAP - On antibiotics. Blood pressure is stable. -Cultures are negative  3.CHF- acute on chronic  systolic, due to lasix held on adm and fluids given - restarted lasix today, CXR with pulmonary vascular congestion and pl effusion and PNA - ECHO with EF 20%, global hypokinesis and critical aortic stenosis - cardiology consulted for the same - start low dose ACEI and beta blocker  4.Anxiety-continue BuSpar. Also add Ativan as needed due to being on steroids  5.COPD exacerbation (chronic obstructive pulmonary disease) (HCC) - cont home meds and inhalers,  -Changed to IV Solu-Medrol due to worsening wheezing. Also nebs added scheduled  6.A-fib (HCC) - rate controlled, cont home meds Not on any calcium channel blockers or beta blockers  7.GERD (gastroesophageal reflux disease) - home dose ppi  8. DVT prophylaxis- on SQ heparin   All the records are reviewed and case discussed with Care Management/Social Workerr. Management plans discussed with the patient, family and they are in agreement.  CODE STATUS: full code   TOTAL TIME TAKING CARE OF THIS PATIENT: 38minutes.   POSSIBLE D/C IN 2 DAYS, DEPENDING ON CLINICAL CONDITION.   Enid BaasKALISETTI,Shavona Gunderman M.D on 07/23/2016 at 1:05 PM  Between 7am to 6pm - Pager - [phone]  After 6pm go to [email or web address] - Social research officer, governmentpassword EPAS [facility]  [facility]  Office  [phone]  CC: Primary care physician; Marisue IvanLINTHAVONG, KANHKA, MD

## 2016-07-24 LAB — BASIC METABOLIC PANEL
Anion gap: 6 (ref 5–15)
BUN: 40 mg/dL — ABNORMAL HIGH (ref 6–20)
CALCIUM: 8.9 mg/dL (ref 8.9–10.3)
CO2: 34 mmol/L — AB (ref 22–32)
CREATININE: 1.33 mg/dL — AB (ref 0.44–1.00)
Chloride: 100 mmol/L — ABNORMAL LOW (ref 101–111)
GFR, EST AFRICAN AMERICAN: 39 mL/min — AB (ref >60)
GFR, EST NON AFRICAN AMERICAN: 33 mL/min — AB (ref >60)
Glucose, Bld: 159 mg/dL — ABNORMAL HIGH (ref 65–99)
Potassium: 3.8 mmol/L (ref 3.5–5.1)
SODIUM: 140 mmol/L (ref 135–145)

## 2016-07-24 LAB — CBC
HCT: 33.4 % — ABNORMAL LOW (ref 35.0–47.0)
Hemoglobin: 11.4 g/dL — ABNORMAL LOW (ref 12.0–16.0)
MCH: 31.3 pg (ref 26.0–34.0)
MCHC: 34.2 g/dL (ref 32.0–36.0)
MCV: 91.5 fL (ref 80.0–100.0)
PLATELETS: 319 10*3/uL (ref 150–440)
RBC: 3.65 MIL/uL — ABNORMAL LOW (ref 3.80–5.20)
RDW: 14.2 % (ref 11.5–14.5)
WBC: 21.6 10*3/uL — AB (ref 3.6–11.0)

## 2016-07-24 LAB — MAGNESIUM: Magnesium: 2 mg/dL (ref 1.7–2.4)

## 2016-07-24 MED ORDER — ALBUTEROL SULFATE (2.5 MG/3ML) 0.083% IN NEBU
3.0000 mL | INHALATION_SOLUTION | Freq: Three times a day (TID) | RESPIRATORY_TRACT | Status: DC
  Administered 2016-07-24 – 2016-07-26 (×7): 3 mL via RESPIRATORY_TRACT
  Filled 2016-07-24 (×7): qty 3

## 2016-07-24 MED ORDER — PREDNISONE 20 MG PO TABS
50.0000 mg | ORAL_TABLET | Freq: Every day | ORAL | Status: DC
  Administered 2016-07-24 – 2016-07-26 (×3): 50 mg via ORAL
  Filled 2016-07-24 (×3): qty 1

## 2016-07-24 MED ORDER — ALBUTEROL SULFATE (2.5 MG/3ML) 0.083% IN NEBU: 2.5000 mg | INHALATION_SOLUTION | RESPIRATORY_TRACT | Status: DC | PRN

## 2016-07-24 MED ORDER — MELATONIN 5 MG PO TABS
5.0000 mg | ORAL_TABLET | Freq: Every evening | ORAL | Status: DC | PRN
  Filled 2016-07-24: qty 1

## 2016-07-24 MED ORDER — BUDESONIDE 0.25 MG/2ML IN SUSP
0.2500 mg | Freq: Two times a day (BID) | RESPIRATORY_TRACT | Status: DC
  Administered 2016-07-24 – 2016-07-26 (×4): 0.25 mg via RESPIRATORY_TRACT
  Filled 2016-07-24 (×4): qty 2

## 2016-07-24 MED ORDER — MELATONIN 3 MG PO TABS
6.0000 mg | ORAL_TABLET | Freq: Every evening | ORAL | Status: DC | PRN
  Filled 2016-07-24: qty 2

## 2016-07-24 NOTE — NC FL2 (Signed)
[place] MEDICAID FL2 LEVEL OF CARE SCREENING TOOL     IDENTIFICATION  Patient Name: Latasha GuernseyOpal P Beck Birthdate: Dec 14, 1920 Sex: female Admission Date (Current Location): 07/20/2016  [place]   Facility and Address:  [facility], [address], [place], [place] [address]      Provider Number: [id number]  Attending Physician Name and Address:  Enid Baasadhika Kalisetti, MD  Relative Name and Phone Number:       Current Level of Care: Hospital Recommended Level of Care: Skilled Nursing Facility Prior Approval Number:    Date Approved/Denied:   PASRR Number:  [id number] [id number])  Discharge Plan: SNF    Current Diagnoses: Patient Active Problem List   Diagnosis Date Noted  . Sepsis (HCC) 07/20/2016  . CAP (community acquired pneumonia) 07/20/2016  . HTN (hypertension) 07/20/2016  . GERD (gastroesophageal reflux disease) 07/20/2016  . COPD (chronic obstructive pulmonary disease) (HCC) 07/20/2016  . A-fib (HCC) 07/20/2016  . Acute on chronic renal failure (HCC) 07/20/2016    Orientation RESPIRATION BLADDER Height & Weight     Self, Place  O2 (2 Liters Oxygen ) Continent Weight: 127 lb 1.6 oz (57.7 kg) Height:  5' 5.5" (166.4 cm)  BEHAVIORAL SYMPTOMS/MOOD NEUROLOGICAL BOWEL NUTRITION STATUS   (none)  (none) Continent Diet (Diet: Heart Healthy )  AMBULATORY STATUS COMMUNICATION OF NEEDS Skin   Extensive Assist Verbally Normal                       Personal Care Assistance Level of Assistance  Bathing, Feeding, Dressing Bathing Assistance: Limited assistance Feeding assistance: Independent Dressing Assistance: Limited assistance     Functional Limitations Info  Sight, Hearing, Speech Sight Info: Impaired Hearing Info: Impaired Speech Info: Adequate    SPECIAL CARE FACTORS FREQUENCY  PT (By licensed PT), OT (By licensed OT)     PT Frequency:  (5) OT Frequency:  (5)            Contractures       Additional Factors Info  Code Status, Allergies   Allergies Info:  (Amoxicillin, Amoxicillin-pot Clavulanate, Aspirin, Cephalexin, Ciprofloxacin, Hydrocodone, Levofloxacin, Metoclopramide, Nabumetone, Rosuvastatin, Sulfa Antibiotics, Budesonide-formoterol Fumarate)           Current Medications (07/24/2016):  This is the current hospital active medication list Current Facility-Administered Medications  Medication Dose Route Frequency Provider Last Rate Last Dose  . acetaminophen (TYLENOL) tablet 650 mg  650 mg Oral Q6H PRN Oralia Manisavid Willis, MD       Or  . acetaminophen (TYLENOL) suppository 650 mg  650 mg Rectal Q6H PRN Oralia Manisavid Willis, MD      . albuterol (PROVENTIL) (2.5 MG/3ML) 0.083% nebulizer solution 2.5 mg  2.5 mg Nebulization Q4H PRN Enid Baasadhika Kalisetti, MD      . albuterol (PROVENTIL) (2.5 MG/3ML) 0.083% nebulizer solution 3 mL  3 mL Inhalation TID Enid Baasadhika Kalisetti, MD   3 mL at 07/24/16 1334  . arformoterol (BROVANA) nebulizer solution 15 mcg  15 mcg Nebulization BID Oralia Manisavid Willis, MD   15 mcg at 07/24/16 0804  . aspirin chewable tablet 81 mg  81 mg Oral San JettyQODAY David Willis, MD   81 mg at 07/23/16 0915  . azithromycin (ZITHROMAX) tablet 250 mg  250 mg Oral Daily Enedina FinnerSona Patel, MD   250 mg at 07/23/16 1706  . budesonide (PULMICORT) nebulizer solution 0.25 mg  0.25 mg Nebulization BID Enid Baasadhika Kalisetti, MD      . busPIRone (BUSPAR) tablet 20 mg  20  mg Oral BID Oralia Manis, MD   20 mg at 07/24/16 1136  . carvedilol (COREG) tablet 6.25 mg  6.25 mg Oral BID WC Enid Baas, MD   6.25 mg at 07/24/16 0814  . cefTRIAXone (ROCEPHIN) 1 g in dextrose 5 % 50 mL IVPB  1 g Intravenous Q24H Enedina Finner, MD   1 g at 07/23/16 1706  . diphenhydrAMINE (BENADRYL) capsule 25 mg  25 mg Oral QHS PRN Oralia Manis, MD      . escitalopram (LEXAPRO) tablet 10 mg  10 mg Oral Annett Fabian, MD   10 mg at 07/24/16 0814  . furosemide (LASIX) tablet 40 mg  40 mg Oral Daily Enid Baas, MD   40 mg at  07/24/16 1136  . guaiFENesin (MUCINEX) 12 hr tablet 600 mg  600 mg Oral BID Enid Baas, MD   600 mg at 07/24/16 1136  . guaiFENesin-dextromethorphan (ROBITUSSIN DM) 100-10 MG/5ML syrup 5 mL  5 mL Oral Q4H PRN Oralia Manis, MD   5 mL at 07/23/16 1705  . heparin injection 5,000 Units  5,000 Units Subcutaneous Q8H Oralia Manis, MD   5,000 Units at 07/24/16 0600  . levothyroxine (SYNTHROID, LEVOTHROID) tablet 75 mcg  75 mcg Oral QAC breakfast Oralia Manis, MD   75 mcg at 07/24/16 [phone]  . lisinopril (PRINIVIL,ZESTRIL) tablet 5 mg  5 mg Oral Daily Enid Baas, MD   5 mg at 07/24/16 1136  . LORazepam (ATIVAN) tablet 0.5 mg  0.5 mg Oral Q6H PRN Enid Baas, MD   0.5 mg at 07/23/16 1008  . MEDLINE mouth rinse  15 mL Mouth Rinse BID Oralia Manis, MD   15 mL at 07/22/16 2149  . montelukast (SINGULAIR) tablet 10 mg  10 mg Oral QPM Oralia Manis, MD   10 mg at 07/23/16 1706  . ondansetron (ZOFRAN) tablet 4 mg  4 mg Oral Q6H PRN Oralia Manis, MD       Or  . ondansetron [facility]) injection 4 mg  4 mg Intravenous Q6H PRN Oralia Manis, MD      . pantoprazole (PROTONIX) EC tablet 40 mg  40 mg Oral Annett Fabian, MD   40 mg at 07/24/16 9604  . potassium chloride SA (K-DUR,KLOR-CON) CR tablet 20 mEq  20 mEq Oral Daily Enid Baas, MD   20 mEq at 07/24/16 1136  . predniSONE (DELTASONE) tablet 50 mg  50 mg Oral Q breakfast Enid Baas, MD   50 mg at 07/24/16 1136  . senna-docusate (Senokot-S) tablet 1 tablet  1 tablet Oral BID Enid Baas, MD   1 tablet at 07/24/16 1136  . sodium chloride flush (NS) 0.9 % injection 3 mL  3 mL Intravenous Q12H Oralia Manis, MD   3 mL at 07/24/16 1137  . tiotropium (SPIRIVA) inhalation capsule 18 mcg  18 mcg Inhalation Annett Fabian, MD   18 mcg at 07/24/16 [phone]  . traMADol (ULTRAM) tablet 50 mg  50 mg Oral Q6H PRN Oralia Manis, MD   50 mg at 07/21/16 1742     Discharge Medications: Please see discharge summary for a list of discharge  medications.  Relevant Imaging Results:  Relevant Lab Results:   Additional Information  (SSN: [id number])  Tige Meas, Darleen Crocker, LCSW

## 2016-07-24 NOTE — Care Management (Signed)
List of hospice agencies provided to daughter at her request

## 2016-07-24 NOTE — Evaluation (Signed)
Physical Therapy Evaluation Patient Details Name: Latasha GuernseyOpal P Beck MRN: [id number] DOB: [birth year] Today's Date: 07/24/2016   History of Present Illness  Pt admitted for sepsis. Pt with complaints of SOB and cough. Pt with history of aortic stenosis, COPD, GERD, and HTN. Pt currently on room air with sats WNL.  Clinical Impression  Pt is a pleasant 82 year old female who was admitted for sepsis. Pt performs bed mobility/transfers with mod I and ambulation with supervision and RW. Pt with good gait speed and able to carry conversation during mobility. No LOB noted. Pt demonstrates deficits with strength/balance/mobility. Educated on continued use of RW for all mobility to improve balance and decrease risk for falls and she endorses that she does not typically use 24/7 at home, however has some fall history. She reports she has caregivers to help with IADLS. Would benefit from skilled PT to address above deficits and promote optimal return to PLOF. Recommend transition to HHPT upon discharge from acute hospitalization.       Follow Up Recommendations Home health PT;Supervision - Intermittent    Equipment Recommendations       Recommendations for Other Services       Precautions / Restrictions Precautions Precautions: Fall Restrictions Weight Bearing Restrictions: No      Mobility  Bed Mobility Overal bed mobility: Modified Independent             General bed mobility comments: safe technique performed with use of bed rail to sit at EOB. Once seated, pt independent with balance. Able to reach outside of BOS to touch therapist hand in multiple directions without LOB  Transfers Overall transfer level: Modified independent Equipment used: Rolling walker (2 wheeled)             General transfer comment: no assist required for mobility. Pt able to stand with RW, however needs cues to push from seated surface   Ambulation/Gait Ambulation/Gait assistance:  Supervision Ambulation Distance (Feet): 100 Feet Assistive device: Rolling walker (2 wheeled) Gait Pattern/deviations: Step-through pattern Gait velocity: able to ambulate 10' in 6 seconds   General Gait Details: reciprocal gait pattern with good speed. RW used for stability with 1 cue to keep close to RW. No LOB noted. O2 sats at 95% on room air post exertion.  Stairs            Wheelchair Mobility    Modified Rankin (Stroke Patients Only)       Balance Overall balance assessment: History of Falls;Needs assistance Sitting-balance support: Feet supported Sitting balance-Leahy Scale: Normal     Standing balance support: Bilateral upper extremity supported Standing balance-Leahy Scale: Good                               Pertinent Vitals/Pain Pain Assessment: No/denies pain    Home Living Family/patient expects to be discharged to:: Private residence Living Arrangements: Alone Available Help at Discharge: Family;Personal care attendant Type of Home: House Home Access: Stairs to enter Entrance Stairs-Rails: Can reach both Entrance Stairs-Number of Steps: 3 Home Layout: One level Home Equipment: Walker - 2 wheels;Walker - 4 wheels;Cane - single point      Prior Function Level of Independence: Independent with assistive device(s)         Comments: household ambulator     Hand Dominance        Extremity/Trunk Assessment   Upper Extremity Assessment: Overall WFL for tasks assessed  Lower Extremity Assessment: Generalized weakness (B LE grossly 4/5)         Communication   Communication: No difficulties  Cognition Arousal/Alertness: Awake/alert Behavior During Therapy: WFL for tasks assessed/performed Overall Cognitive Status: No family/caregiver present to determine baseline cognitive functioning (appears confused to situation)                      General Comments      Exercises Other Exercises Other  Exercises: Pt performed seated ther-ex including B LE alt marching, LAQ, and hip abd/add. All ther-ex performed x 10 reps with cga and cues for correct technique.   Assessment/Plan    PT Assessment Patient needs continued PT services  PT Problem List Decreased strength;Decreased balance;Decreased mobility          PT Treatment Interventions DME instruction;Gait training;Therapeutic exercise    PT Goals (Current goals can be found in the Care Plan section)  Acute Rehab PT Goals Patient Stated Goal: to improve balance PT Goal Formulation: With patient Time For Goal Achievement: 08/07/16 Potential to Achieve Goals: Good    Frequency Min 2X/week   Barriers to discharge        Co-evaluation               End of Session Equipment Utilized During Treatment: Gait belt Activity Tolerance: Patient tolerated treatment well Patient left: in chair;with chair alarm set Nurse Communication: Mobility status         Time: 1610-9604 PT Time Calculation (min) (ACUTE ONLY): 20 min   Charges:   PT Evaluation $PT Eval Low Complexity: 1 Procedure PT Treatments $Therapeutic Exercise: 8-22 mins   PT G Codes:        Peyson Postema 2016-08-07, 11:33 AM  Elizabeth Palau, PT, DPT [phone]

## 2016-07-24 NOTE — Progress Notes (Signed)
Clinical Child psychotherapistocial Worker (CSW) received verbal consult from MD that patient's daughter is requesting for patient to go to rehab. PT is recommending home health. CSW attempted to contact patient's daughter Gigi Gineggy to discuss D/C plan. She did not answer and a voicemail was left. CSW will continue to follow and assist as needed.   [employer] Hughes IncorporatedBailey Murl Golladay, LCSW [phone]

## 2016-07-24 NOTE — Progress Notes (Signed)
[employer] - [place] [facility]   PATIENT NAME: Latasha Beck    MR#:  [id number]  DATE OF BIRTH:  08-05-1921  SUBJECTIVE:  CHIEF COMPLAINT:   Chief Complaint  Patient presents with  . Shortness of Breath   - more alert, no confusion today, breathing has improved - prn o2 now  REVIEW OF SYSTEMS:  Review of Systems  Constitutional: Positive for malaise/fatigue. Negative for chills and fever.  HENT: Positive for hearing loss. Negative for ear discharge, ear pain and nosebleeds.   Eyes: Negative for blurred vision and double vision.  Respiratory: Positive for cough, shortness of breath and wheezing.   Cardiovascular: Negative for chest pain, palpitations and leg swelling.  Gastrointestinal: Positive for constipation. Negative for abdominal pain, diarrhea, nausea and vomiting.  Genitourinary: Negative for dysuria and urgency.  Musculoskeletal: Negative for myalgias.  Neurological: Negative for dizziness, sensory change, speech change, focal weakness, seizures and headaches.  Psychiatric/Behavioral: Negative for depression.    DRUG ALLERGIES:   Allergies  Allergen Reactions  . Amoxicillin Nausea Only    Has patient had a PCN reaction causing immediate rash, facial/tongue/throat swelling, SOB or lightheadedness with hypotension: No Has patient had a PCN reaction causing severe rash involving mucus membranes or skin necrosis: No Has patient had a PCN reaction that required hospitalization No Has patient had a PCN reaction occurring within the last 10 years: Yes If all of the above answers are "NO", then may proceed with Cephalosporin use.  Marland Kitchen Amoxicillin-Pot Clavulanate     Other reaction(s): Other (See Comments) Gi upset  . Aspirin     Other reaction(s): Other (See Comments) In large doses - swelling of eyes  . Cephalexin     Other reaction(s): Other (See Comments) GI upset  . Ciprofloxacin     Other reaction(s): Other (See Comments) GI upset  .  Hydrocodone Nausea Only  . Levofloxacin     Other reaction(s): Other (See Comments) GI upset  . Metoclopramide     Other reaction(s): Other (See Comments) Other Reaction: Not Assessed  . Nabumetone     Other reaction(s): Unknown  . Rosuvastatin     Other reaction(s): Muscle Pain  . Sulfa Antibiotics Other (See Comments)  . Budesonide-Formoterol Fumarate Palpitations    VITALS:  Blood pressure (!) 161/81, pulse 77, temperature 98.1 F (36.7 C), temperature source Oral, resp. rate 18, height 5' 5.5" (1.664 m), weight 57.7 kg (127 lb 1.6 oz), SpO2 92 %.  PHYSICAL EXAMINATION:  Physical Exam  GENERAL:  82 y.o.-year-old elderly patient lying in the bed with no acute distress.  EYES: Pupils equal, round, reactive to light and accommodation. No scleral icterus. Extraocular muscles intact.  HEENT: Head atraumatic, normocephalic. Oropharynx and nasopharynx clear.  NECK:  Supple, no jugular venous distention. No thyroid enlargement, no tenderness.  LUNGS: minimal expiratory wheezing- much better today, coarse rhonchi at the bases. No use of accessory muscles of respiration.  CARDIOVASCULAR: S1, S2 normal. No  rubs, or gallops. Harsh 3/6 systolic murmur present ABDOMEN: Soft, nontender, nondistended. Bowel sounds present. No organomegaly or mass.  EXTREMITIES: No pedal edema, cyanosis, or clubbing.  NEUROLOGIC: Cranial nerves II through XII are intact. Muscle strength 5/5 in all extremities. Sensation intact. Gait not checked.  PSYCHIATRIC: The patient is alert and oriented x 3.  SKIN: No obvious rash, lesion, or ulcer.    LABORATORY PANEL:   CBC  Recent Labs Lab 07/24/16 0333  WBC 21.6*  HGB 11.4*  HCT 33.4*  PLT  319   ------------------------------------------------------------------------------------------------------------------  Chemistries   Recent Labs Lab 07/20/16 1807  07/24/16 0333  NA 138  < > 140  K 3.2*  < > 3.8  CL 97*  < > 100*  CO2 28  < > 34*  GLUCOSE  129*  < > 159*  BUN 47*  < > 40*  CREATININE 2.17*  < > 1.33*  CALCIUM 8.9  < > 8.9  MG  --   < > 2.0  AST 63*  --   --   ALT 28  --   --   ALKPHOS 71  --   --   BILITOT 1.7*  --   --   < > = values in this interval not displayed. ------------------------------------------------------------------------------------------------------------------  Cardiac Enzymes  Recent Labs Lab 07/20/16 1807  TROPONINI 0.08*   ------------------------------------------------------------------------------------------------------------------  RADIOLOGY:  Dg Chest 2 View  Result Date: 07/23/2016 CLINICAL DATA:  Pneumonia. Shortness of breath. COPD. Hypertension. EXAM: CHEST  2 VIEW COMPARISON:  07/20/2016 FINDINGS: Osteopenia. Patient rotated right. Mild cardiomegaly. Probable trace bilateral pleural effusions. No pneumothorax. Increased pulmonary interstitial thickening, suggesting venous congestion. Persistent right base airspace disease. Mild subsegmental atelectasis in the left lung base. IMPRESSION: Similar right base airspace disease, favor infection or aspiration. Suspect developing pulmonary venous congestion. Electronically Signed   By: Jeronimo GreavesKyle  Talbot M.D.   On: 07/23/2016 09:56    EKG:   Orders placed or performed during the hospital encounter of 07/20/16  . EKG 12-Lead  . EKG 12-Lead    ASSESSMENT AND PLAN:   [name] a 82 y.o.femalewho presents with Shortness of breath and increasing productive cough over the last few days. Here in the ED she was found to meet sepsis criteria with tachycardia, elevated white count and lactic acidosis. Chest x-ray showed pneumonia  1.Sepsis (HCC)-secondary to pneumonia.  -Iv rocephin and zithromax -negative cultures and  Improving WBC -Flutter valve added and Mucinex  2. CAP - On antibiotics. Blood pressure is stable. -Cultures are negative  3.CHF- acute on chronic systolic, due to lasix held on adm and fluids given - restarted lasix,  CXR with pulmonary vascular congestion and pl effusion and PNA- f/u CXR tomorrow - ECHO with EF 20%, global hypokinesis and critical aortic stenosis - cardiology consulted for the same - started low dose ACEI and beta blocker  4.Anxiety-continue BuSpar. Also add Ativan as needed due to being on steroids  5.COPD exacerbation (chronic obstructive pulmonary disease) (HCC) - cont home meds and inhalers,  - Improving today- change IV solumedrol to oral prednisone starting tomorrow  Also nebs added scheduled  6.A-fib (HCC) - rate controlled, cont home meds On coreg  7.GERD (gastroesophageal reflux disease) - home dose ppi  8. DVT prophylaxis- on SQ heparin  Physical Therapy consult, family interested in rehab   All the records are reviewed and case discussed with Care Management/Social Workerr. Management plans discussed with the patient, family and they are in agreement.  CODE STATUS: full code   TOTAL TIME TAKING CARE OF THIS PATIENT: 36minutes.   POSSIBLE D/C IN 2 DAYS, DEPENDING ON CLINICAL CONDITION.   Enid BaasKALISETTI,Jahnavi Muratore M.D on 07/24/2016 at 10:24 AM  Between 7am to 6pm - Pager - [phone]  After 6pm go to [email or web address] - Social research officer, governmentpassword EPAS [email or web address]  [facility]  Office  [phone]  CC: Primary care physician; Marisue IvanLINTHAVONG, KANHKA, MD

## 2016-07-24 NOTE — Consult Note (Signed)
[facility]  CARDIOLOGY CONSULT NOTE  Patient ID: Latasha Beck MRN: [id number] DOB/AGE: 05-Sep-1921 82 y.o.  Admit date: 07/20/2016 Referring Physician Nemiah Commander Primary Physician [facility] Primary Cardiologist Fath Reason for Consultation Aortic stenosis, cardiomyopathy  HPI: 82 year old female referred for evaluation for aortic stenosis, cardiomyopathy, and presyncope. The patient was admitted 07/20/2016 for shortness of breath, chest x-ray consistent with pneumonia. The patient has known history of aortic stenosis, chronic exertional dyspnea, and intermittent episodes of presyncope. 2-D echocardiogram 07/22/2016 revealed LVEF of 20-25%, with calculated aortic valve area 0.57 cm, with mean gradient of 46 mmHg and peak gradient of 82 mmHg. Overall, the patient has shown slow progress with antibiotic therapy for pneumonia.  Review of systems complete and found to be negative unless listed above     Past Medical History:  Diagnosis Date  . Aortic stenosis   . Cataract   . COPD (chronic obstructive pulmonary disease) (HCC)   . GERD (gastroesophageal reflux disease)   . HTN (hypertension)   . Hypertension   . Hypothyroidism   . Vertigo     Past Surgical History:  Procedure Laterality Date  . ABDOMINAL HYSTERECTOMY    . APPENDECTOMY    . CARPAL TUNNEL RELEASE Right   . CATARACT EXTRACTION Bilateral   . CHOLECYSTECTOMY      Prescriptions Prior to Admission  Medication Sig Dispense Refill Last Dose  . acetaminophen (TYLENOL) 500 MG tablet Take 1,000 mg by mouth 2 (two) times daily as needed for pain.   07/20/2016 at Unknown time  . albuterol (PROVENTIL HFA;VENTOLIN HFA) 108 (90 Base) MCG/ACT inhaler Inhale 2 puffs into the lungs every 4 (four) hours as needed for wheezing.   07/20/2016 at Unknown time  . albuterol (PROVENTIL) (2.5 MG/3ML) 0.083% nebulizer solution Inhale 2.5 mg into the lungs every 6 (six) hours as needed for wheezing.   07/20/2016 at Unknown time  . aspirin 81 MG  chewable tablet Chew 81 mg by mouth every other day.   07/20/2016 at 0930  . busPIRone (BUSPAR) 10 MG tablet Take 20 mg by mouth 2 (two) times daily.   07/20/2016 at Unknown time  . escitalopram (LEXAPRO) 10 MG tablet Take 10 mg by mouth every morning.   07/20/2016 at Unknown time  . fluticasone (FLOVENT HFA) 110 MCG/ACT inhaler Inhale 2 puffs into the lungs 2 (two) times daily.   07/20/2016 at Unknown time  . furosemide (LASIX) 40 MG tablet Take 40 mg by mouth every morning.   07/20/2016 at Unknown time  . L-Methylfolate-Algae (DEPLIN 15 PO) Take 15 mg by mouth every morning.   07/20/2016 at Unknown time  . levothyroxine (SYNTHROID, LEVOTHROID) 75 MCG tablet Take 75 mcg by mouth daily before breakfast.   07/20/2016 at Unknown time  . meclizine (ANTIVERT) 12.5 MG tablet Take 12.5 mg by mouth 3 (three) times daily as needed. For inner ear.   Past Month at Unknown time  . Melatonin 3 MG TABS Take 6 mg by mouth at bedtime.   07/19/2016 at Unknown time  . montelukast (SINGULAIR) 10 MG tablet Take 10 mg by mouth every evening. At 4 pm.   07/20/2016 at Unknown time  . Omega-3 Fatty Acids (FISH OIL PO) Take 1 capsule by mouth 2 (two) times daily.   07/20/2016 at Unknown time  . pantoprazole (PROTONIX) 40 MG tablet Take 40 mg by mouth every morning.   07/20/2016 at Unknown time  . raloxifene (EVISTA) 60 MG tablet Take 60 mg by mouth every morning.   07/20/2016  at Unknown time  . salmeterol (SEREVENT DISKUS) 50 MCG/DOSE diskus inhaler Inhale 1 puff into the lungs every 12 (twelve) hours.   07/20/2016 at Unknown time  . senna-docusate (SENOKOT-S) 8.6-50 MG tablet Take 1 tablet by mouth 2 (two) times daily as needed for constipation.   Past Week at Unknown time  . tiotropium (SPIRIVA) 18 MCG inhalation capsule Place 18 mcg into inhaler and inhale every morning.   07/20/2016 at Unknown time  . traMADol (ULTRAM) 50 MG tablet Take 50 mg by mouth every 6 (six) hours as needed for pain.   07/20/2016 at Unknown time  . traZODone  (DESYREL) 100 MG tablet Take 200 mg by mouth at bedtime.   07/19/2016 at Unknown time   Social History   Social History  . Marital status: Widowed    Spouse name: N/A  . Number of children: N/A  . Years of education: N/A   Occupational History  . Not on file.   Social History Main Topics  . Smoking status: Never Smoker  . Smokeless tobacco: Never Used  . Alcohol use No  . Drug use: No  . Sexual activity: Not Currently   Other Topics Concern  . Not on file   Social History Narrative  . No narrative on file    Family History  Problem Relation Age of Onset  . Heart disease Mother   . Stroke Father   . Diabetes Father   . Heart disease Brother       Review of systems complete and found to be negative unless listed above      PHYSICAL EXAM  General: Well developed, well nourished, in no acute distress HEENT:  Normocephalic and atramatic Neck:  No JVD.  Lungs: Clear bilaterally to auscultation and percussion. Heart: HRRR . Normal S1 and S2 without gallops or murmurs.  Abdomen: Bowel sounds are positive, abdomen soft and non-tender  Msk:  Back normal, normal gait. Normal strength and tone for age. Extremities: No clubbing, cyanosis or edema.   Neuro: Alert and oriented X 3. Psych:  Good affect, responds appropriately  Labs:   Lab Results  Component Value Date   WBC 21.6 (H) 07/24/2016   HGB 11.4 (L) 07/24/2016   HCT 33.4 (L) 07/24/2016   MCV 91.5 07/24/2016   PLT 319 07/24/2016    Recent Labs Lab 07/20/16 1807  07/24/16 0333  NA 138  < > 140  K 3.2*  < > 3.8  CL 97*  < > 100*  CO2 28  < > 34*  BUN 47*  < > 40*  CREATININE 2.17*  < > 1.33*  CALCIUM 8.9  < > 8.9  PROT 7.4  --   --   BILITOT 1.7*  --   --   ALKPHOS 71  --   --   ALT 28  --   --   AST 63*  --   --   GLUCOSE 129*  < > 159*  < > = values in this interval not displayed. Lab Results  Component Value Date   TROPONINI 0.08 (HH) 07/20/2016   No results found for: CHOL No results found  for: HDL No results found for: LDLCALC No results found for: TRIG No results found for: CHOLHDL No results found for: LDLDIRECT    Radiology: Dg Chest 2 View  Result Date: 07/23/2016 CLINICAL DATA:  Pneumonia. Shortness of breath. COPD. Hypertension. EXAM: CHEST  2 VIEW COMPARISON:  07/20/2016 FINDINGS: Osteopenia. Patient rotated right. Mild cardiomegaly. Probable  trace bilateral pleural effusions. No pneumothorax. Increased pulmonary interstitial thickening, suggesting venous congestion. Persistent right base airspace disease. Mild subsegmental atelectasis in the left lung base. IMPRESSION: Similar right base airspace disease, favor infection or aspiration. Suspect developing pulmonary venous congestion. Electronically Signed   By: Jeronimo GreavesKyle  Talbot M.D.   On: 07/23/2016 09:56   Dg Chest 2 View  Result Date: 07/20/2016 CLINICAL DATA:  82 y/o F; increasing shortness of breath and productive cough for 2 days. EXAM: CHEST  2 VIEW COMPARISON:  01/16/2015 chest radiograph. FINDINGS: The patient is rotated. Stable cardiomediastinal silhouette given projection and technique. No acute osseous abnormality is identified. Right lung base patchy opacification may represent atelectasis or pneumonia. Interval resolution of right pleural effusion. Elevated left hemidiaphragm is stable. No pneumothorax. IMPRESSION: Right basilar patchy opacity may represent atelectasis or pneumonia. Electronically Signed   By: Mitzi HansenLance  Furusawa-Stratton M.D.   On: 07/20/2016 19:16    EKG: Atrial fibrillation with left bundle branch block  ASSESSMENT AND PLAN:   1. Shortness of breath, likely multifactorial, primarily due to pneumonia 2. Severe aortic stenosis, not felt to be a candidate for aortic valve replacement surgery, or TAVR 3. Severe cardiomyopathy of unknown etiology  Recommendations  1. Agree with overall current therapy 2. Continue diuresis 3. Defer consideration for AVR or TAVR  Signed: Izan Miron  MD,PhD, [facility] 07/24/2016, 1:17 PM

## 2016-07-24 NOTE — Progress Notes (Signed)
Physical Therapy Treatment Patient Details Name: Latasha Beck MRN: [id number] DOB: [birth year] Today's Date: 07/24/2016    History of Present Illness Pt admitted for sepsis. Pt with complaints of SOB and cough. Pt with history of aortic stenosis, COPD, GERD, and HTN. Pt currently on room air with sats WNL.    PT Comments    This therapist was requested to see patient for additional treatment session per family and social work. Family very concerned with upcoming discharge due to new cognitive deficits/confusion and lack of assist available within the home. Per clarification with family (different than what patient previously reported), patient lives alone and at baseline is fully indep with all ADLs and mobility (without assist device).  Family does endorse helper is available for groceries and errands (patient also received meals on wheels), but denies aide for ADLs and other household chores.  Family does report 3 falls within week prior to admission, likely related to acute illness/functional decline; states cognitive changes/confusion are new for patient. Attempted to engage patient in conversation regarding discharge planning, but patient with marked difficulty following line of thought and processing more complex information.  Does tend to repeat self frequently and ask repeated questions, suggesting some degree of STM deficit.  Question ability to manage medications, recognize signs/symptoms of cardiopulmonary decline and problem-solve household emergency procedures at this time. Given clarification regarding social history and PLOF, and noted balance deficits in comparison to baseline (without use of assist device), may consider transition to STR upon discharge.  Will see for one additional session tomorrow AM and update recommendations if appropriate.  Information communicated to social work and family; both in agreement with plan.   Follow Up Recommendations  Home health PT;SNF (pending  progress/performance next date)     Equipment Recommendations  Rolling walker with 5" wheels    Recommendations for Other Services       Precautions / Restrictions Precautions Precautions: Fall Restrictions Weight Bearing Restrictions: No    Mobility  Bed Mobility Overal bed mobility: Modified Independent             General bed mobility comments: able to negotiate LEs, elevate trunk without therapist assist  Transfers Overall transfer level: Needs assistance   Transfers: Sit to/from Stand Sit to Stand: Min assist         General transfer comment: min assist for posterior LOB with initial sit/stand  Ambulation/Gait Ambulation/Gait assistance: Min assist Ambulation Distance (Feet): 75 Feet Assistive device: None     Gait velocity interpretation: <1.8 ft/sec, indicative of risk for recurrent falls General Gait Details: reciprocal stepping pattern with decreased step height/length R LE; slow cadence with decreased trunk rotation and arm swing; frequent R lateral sway/LOB requiring therapist assist to recover   Stairs            Wheelchair Mobility    Modified Rankin (Stroke Patients Only)       Balance Overall balance assessment: Needs assistance Sitting-balance support: No upper extremity supported;Feet supported Sitting balance-Leahy Scale: Normal     Standing balance support: No upper extremity supported Standing balance-Leahy Scale: Poor                      Cognition Arousal/Alertness: Awake/alert Behavior During Therapy: WFL for tasks assessed/performed Overall Cognitive Status: Impaired/Different from baseline (per clarification from family, patient confusion new and not baseline for her.  currently oriented to self, location only; poor awareness of deficits, need for assist; noted deficits in STM; question ability to  problem-solve simple emergency procedures) Area of Impairment: Memory;Orientation;Safety/judgement;Awareness                     Exercises Other Exercises Other Exercises: 125' with RW, cga/close sup--improved fluidity and gait symmetry; continues to demonstrate mild instability with head turns and dynamic gait components. Limited insight into deficits and need for assist/use of RW; anticipate patient attempting mobility wihtout assist device (thus increasing fall risk) if not constantly instructed by helper in safety cues/needs    General Comments        Pertinent Vitals/Pain Pain Assessment: No/denies pain    Home Living                      Prior Function            PT Goals (current goals can now be found in the care plan section) Acute Rehab PT Goals Patient Stated Goal: to improve balance PT Goal Formulation: With patient Time For Goal Achievement: 08/07/16 Potential to Achieve Goals: Good Progress towards PT goals: Progressing toward goals    Frequency    Min 2X/week      PT Plan Current plan remains appropriate    Co-evaluation             End of Session Equipment Utilized During Treatment: Gait belt Activity Tolerance: Patient tolerated treatment well Patient left: in chair;with call bell/phone within reach;with chair alarm set;with family/visitor present (respiratory at bedside for breathing treatment)     Time: 1345-1415 PT Time Calculation (min) (ACUTE ONLY): 30 min  Charges:  $Gait Training: 8-22 mins $Therapeutic Activity: 8-22 mins                    G Codes:       Latasha Beck, PT, DPT, NCS 07/24/16, 4:51 PM [phone]

## 2016-07-24 NOTE — Progress Notes (Signed)
MEDICATION RELATED CONSULT NOTE -follow up  Pharmacy Consult for electrolyte replacement Indication: hypokalemia   Patient Measurements: Height: 5' 5.5" (166.4 cm) Weight: 127 lb 1.6 oz (57.7 kg) IBW/kg (Calculated) : 58.15 Adjusted Body Weight:    Vital Signs: Temp: 98.1 F (36.7 C) (09/25 0752) Temp Source: Oral (09/25 0752) BP: 161/81 (09/25 0752) Pulse Rate: 77 (09/25 0752) Intake/Output from previous day: 09/24 0701 - 09/25 0700 In: 480 [P.O.:480] Out: 3 [Urine:2; Stool:1] Intake/Output from this shift: No intake/output data recorded.  Labs:  Recent Labs  07/21/16 1749 07/22/16 0421 07/23/16 0415 07/24/16 0333  WBC  --   --  15.3* 21.6*  HGB  --   --  11.4* 11.4*  HCT  --   --  33.8* 33.4*  PLT  --   --  244 319  CREATININE  --  1.33* 1.18* 1.33*  MG 1.8  --  2.2 2.0   Estimated Creatinine Clearance: 24.1 mL/min (by C-G formula based on SCr of 1.33 mg/dL (H)).   Medical History: Past Medical History:  Diagnosis Date  . Aortic stenosis   . Cataract   . COPD (chronic obstructive pulmonary disease) (HCC)   . GERD (gastroesophageal reflux disease)   . HTN (hypertension)   . Hypertension   . Hypothyroidism   . Vertigo     Medications:  Scheduled:  . albuterol  3 mL Inhalation Q4H  . arformoterol  15 mcg Nebulization BID  . aspirin  81 mg Oral QODAY  . azithromycin  250 mg Oral Daily  . busPIRone  20 mg Oral BID  . carvedilol  6.25 mg Oral BID WC  . cefTRIAXone (ROCEPHIN)  IV  1 g Intravenous Q24H  . escitalopram  10 mg Oral BH-q7a  . furosemide  40 mg Oral Daily  . guaiFENesin  600 mg Oral BID  . heparin  5,000 Units Subcutaneous Q8H  . levothyroxine  75 mcg Oral QAC breakfast  . lisinopril  5 mg Oral Daily  . mouth rinse  15 mL Mouth Rinse BID  . methylPREDNISolone (SOLU-MEDROL) injection  60 mg Intravenous Q12H  . montelukast  10 mg Oral QPM  . pantoprazole  40 mg Oral BH-q7a  . potassium chloride  20 mEq Oral Daily  . senna-docusate  1  tablet Oral BID  . sodium chloride flush  3 mL Intravenous Q12H  . tiotropium  18 mcg Inhalation BH-q7a    Assessment: 82yo female admitted for sepsis now with hypokalemia.   9/24 AM K= 3.6,  Mag= 2.2 9/25 AM K=3.8, Mg=2.0  Goal of Therapy:  Maintain electrolytes within normal range  Plan:  Will need to replete cautiously due to poor renal function. Renal fxn worsened 9/25.  Electrolytes WNL. Will recheck electrolytes in am.  Rella LarveKelly Fuhrmann, PharmD 07/24/2016 10:13 AM

## 2016-07-24 NOTE — Clinical Social Work Placement (Signed)
   CLINICAL SOCIAL WORK PLACEMENT  NOTE  Date:  07/24/2016  Patient Details  Name: Latasha Beck MRN: [id number] Date of Birth: 07-27-1921  Clinical Social Work is seeking post-discharge placement for this patient at the Skilled  Nursing Facility level of care (*CSW will initial, date and re-position this form in  chart as items are completed):  Yes   Patient/family provided with [facility] Clinical Social Work Department's list of facilities offering this level of care within the geographic area requested by the patient (or if unable, by the patient's family).  Yes   Patient/family informed of their freedom to choose among providers that offer the needed level of care, that participate in Medicare, Medicaid or managed care program needed by the patient, have an available bed and are willing to accept the patient.  Yes   Patient/family informed of [facility] ownership interest in [facility] and [facility], as well as of the fact that they are under no obligation to receive care at these facilities.  PASRR submitted to EDS on 07/24/16     PASRR number received on 07/24/16     Existing PASRR number confirmed on       FL2 transmitted to all facilities in geographic area requested by pt/family on 07/24/16     FL2 transmitted to all facilities within larger geographic area on       Patient informed that his/her managed care company has contracts with or will negotiate with certain facilities, including the following:            Patient/family informed of bed offers received.  Patient chooses bed at       Physician recommends and patient chooses bed at      Patient to be transferred to   on  .  Patient to be transferred to facility by       Patient family notified on   of transfer.  Name of family member notified:        PHYSICIAN       Additional Comment:    _______________________________________________ Tila Millirons, Darleen CrockerBailey M, LCSW 07/24/2016, 3:07 PM

## 2016-07-24 NOTE — Clinical Social Work Note (Signed)
Clinical Social Work Assessment  Patient Details  Name: Latasha Beck MRN: [id number] Date of Birth: 07-24-1921  Date of referral:  07/24/16               Reason for consult:  Facility Placement                Permission sought to share information with:  Latasha Beck granted to share information::  Yes, Verbal Permission Granted  Name::        Agency::     Relationship::     Contact Information:     Housing/Transportation Living arrangements for the past 2 months:  Single Family Home Source of Information:  Power of Latasha Beck, Adult Children, Other (Comment Required) (Niece ) Patient Interpreter Needed:  None Criminal Activity/Legal Involvement Pertinent to Current Situation/Hospitalization:  No - Comment as needed Significant Relationships:  Adult Children, Other Family Members Lives with:  Self Do you feel safe going back to the place where you live?    Need for family participation in patient care:  Yes (Comment)  Care giving concerns:  Patient lives alone in [place].    Social Worker assessment / plan:  [name] received verbal consult from MD that patient needs to go to SNF. PT is recommending home health. CSW met with patient and her daughter Latasha Beck and her niece was at bedside. Per daughter she is patient's HPOA and lives in [place]. Daughter reported that patient has 3 adult children. CSW explained to daughter that patient has met the 3 night qualifying inpatient stay criteria as she was admitted to inpatient on 07/20/16 however PT is recommending home health. Per daughter patient was fallen 3 times in the last week and she wants patient to go to short term rehab. Niece reported that she is a PT and feels strongly that patient can benefit from SNF. Wife requested to speak to PT. CSW made Lead PT aware of above. Per daughter she prefers [name]. CSW explained to daughter that a referral can be sent and  a case can be made because MD is recommending SNF. Per daughter MD mentioned hospice and is interested in pursing that. CSW explained that patient cannot go to rehab and receive hospice services at the same time. Daughter verbalized her understanding. CSW provided daughter with a list of hospice agencies. MD ordered a palliative care consult. CSW also discussed long term care options at ALF or SNF. Per daughter patient has been doing well at home alone up until last week.   FL2 complete and faxed out. CSW will continue to follow and assist as needed.   Employment status:  Retired Forensic scientist:  Medicare PT Recommendations:  Home with Latasha Beck / Referral to community resources:  Latasha Beck  Patient/Family's Response to care:  Patient's daughter is adament about patient going to SNF.   Patient/Family's Understanding of and Emotional Response to Diagnosis, Current Treatment, and Prognosis:  Patient and niece believe that patient's cognition has declined and needs SNF. CSW provided education about SNF placement and emotionally support.   Emotional Assessment Appearance:  Appears stated age Attitude/Demeanor/Rapport:    Affect (typically observed):  Pleasant Orientation:  Oriented to Self, Oriented to Place, Fluctuating Orientation (Suspected and/or reported Sundowners) Alcohol / Substance use:  Not Applicable Psych involvement (Current and /or in the community):  No (Comment)  Discharge Needs  Concerns to be addressed:  Discharge Planning Concerns Readmission within the last 30 days:  No Current discharge risk:  Chronically ill, Cognitively Impaired Barriers to Discharge:  Continued Medical Work up   [name], Latasha Beets, LCSW 07/24/2016, 3:10 PM

## 2016-07-24 NOTE — Care Management Note (Signed)
Case Management Note  Patient Details  Name: Latasha Beck MRN: [id number] Date of Birth: Aug 05, 1921  Subjective/Objective:   RNCM assessment for discharge planning. Met with daughter, Vickii Chafe, at bedside. She is interested in SNF. I explained PT recommendations and Medicare guidelines. She would like to speak further with the CSW. CSW updated. LIst of home health providers given and a list of private pay sitters given. Patient will need HH SN, PT, OT HHA and SW.She lives alone. PCP is Dr. Netty Starring.               Action/Plan: Will follow up on choice of [facility] choice.   Expected Discharge Date:  07/22/16               Expected Discharge Plan:  [place]  In-House Referral:     Discharge planning Services  CM Consult  Post Acute Care Choice:  Home Health Choice offered to:  Adult Children  DME Arranged:    DME Agency:     HH Arranged:  PT, RN, OT, Nurse's Aide, Social Work [employer] Agency:     Status of Service:  In process, will continue to follow  If discussed at Long Length of Stay Meetings, dates discussed:    Additional Comments:  Jolly Mango, RN 07/24/2016, 12:24 PM

## 2016-07-24 NOTE — Care Management Important Message (Signed)
Important Message  Patient Details  Name: Latasha GuernseyOpal P Beck MRN: [id number] Date of Birth: 01-11-1921   Medicare Important Message Given:  Yes    Marily MemosLisa M Glendal Cassaday, RN 07/24/2016, 9:15 AM

## 2016-07-25 ENCOUNTER — Inpatient Hospital Stay: Payer: Medicare Other

## 2016-07-25 DIAGNOSIS — I509 Heart failure, unspecified: Secondary | ICD-10-CM

## 2016-07-25 DIAGNOSIS — Z789 Other specified health status: Secondary | ICD-10-CM

## 2016-07-25 DIAGNOSIS — J189 Pneumonia, unspecified organism: Secondary | ICD-10-CM

## 2016-07-25 DIAGNOSIS — Z66 Do not resuscitate: Secondary | ICD-10-CM

## 2016-07-25 DIAGNOSIS — N189 Chronic kidney disease, unspecified: Secondary | ICD-10-CM

## 2016-07-25 DIAGNOSIS — M6281 Muscle weakness (generalized): Secondary | ICD-10-CM

## 2016-07-25 DIAGNOSIS — N179 Acute kidney failure, unspecified: Secondary | ICD-10-CM

## 2016-07-25 LAB — BASIC METABOLIC PANEL
ANION GAP: 10 (ref 5–15)
BUN: 46 mg/dL — ABNORMAL HIGH (ref 6–20)
CALCIUM: 8.9 mg/dL (ref 8.9–10.3)
CO2: 29 mmol/L (ref 22–32)
CREATININE: 1.31 mg/dL — AB (ref 0.44–1.00)
Chloride: 102 mmol/L (ref 101–111)
GFR, EST AFRICAN AMERICAN: 39 mL/min — AB (ref >60)
GFR, EST NON AFRICAN AMERICAN: 34 mL/min — AB (ref >60)
Glucose, Bld: 111 mg/dL — ABNORMAL HIGH (ref 65–99)
Potassium: 3.6 mmol/L (ref 3.5–5.1)
SODIUM: 141 mmol/L (ref 135–145)

## 2016-07-25 LAB — CULTURE, BLOOD (ROUTINE X 2)
CULTURE: NO GROWTH
CULTURE: NO GROWTH

## 2016-07-25 LAB — MAGNESIUM: Magnesium: 2 mg/dL (ref 1.7–2.4)

## 2016-07-25 MED ORDER — HYDROCOD POLST-CPM POLST ER 10-8 MG/5ML PO SUER
5.0000 mL | Freq: Two times a day (BID) | ORAL | Status: DC
  Administered 2016-07-25 – 2016-07-26 (×3): 5 mL via ORAL
  Filled 2016-07-25 (×3): qty 5

## 2016-07-25 NOTE — Progress Notes (Signed)
[facility] [place] [facility]   PATIENT NAME: Latasha Beck    MR#:  [id number]  DATE OF BIRTH:  [birth year]  SUBJECTIVE:  CHIEF COMPLAINT:   Chief Complaint  Patient presents with  . Shortness of Breath   -seems alert, but feels short of breath, though not dyspneic or tachypneic, feels smothered. sats on room air 95% - refusing therapy initially this morning  REVIEW OF SYSTEMS:  Review of Systems  Constitutional: Positive for malaise/fatigue. Negative for chills and fever.  HENT: Positive for hearing loss. Negative for ear discharge, ear pain and nosebleeds.   Eyes: Negative for blurred vision and double vision.  Respiratory: Positive for cough and shortness of breath. Negative for wheezing.   Cardiovascular: Negative for chest pain, palpitations and leg swelling.  Gastrointestinal: Negative for abdominal pain, constipation, diarrhea, nausea and vomiting.  Genitourinary: Negative for dysuria and urgency.  Musculoskeletal: Negative for myalgias.  Neurological: Negative for dizziness, sensory change, speech change, focal weakness, seizures and headaches.  Psychiatric/Behavioral: Negative for depression.    DRUG ALLERGIES:   Allergies  Allergen Reactions  . Amoxicillin Nausea Only    Has patient had a PCN reaction causing immediate rash, facial/tongue/throat swelling, SOB or lightheadedness with hypotension: No Has patient had a PCN reaction causing severe rash involving mucus membranes or skin necrosis: No Has patient had a PCN reaction that required hospitalization No Has patient had a PCN reaction occurring within the last 10 years: Yes If all of the above answers are "NO", then may proceed with Cephalosporin use.  Marland Kitchen Amoxicillin-Pot Clavulanate     Other reaction(s): Other (See Comments) Gi upset  . Aspirin     Other reaction(s): Other (See Comments) In large doses - swelling of eyes  . Cephalexin     Other reaction(s): Other (See Comments) GI  upset  . Ciprofloxacin     Other reaction(s): Other (See Comments) GI upset  . Hydrocodone Nausea Only  . Levofloxacin     Other reaction(s): Other (See Comments) GI upset  . Metoclopramide     Other reaction(s): Other (See Comments) Other Reaction: Not Assessed  . Nabumetone     Other reaction(s): Unknown  . Rosuvastatin     Other reaction(s): Muscle Pain  . Sulfa Antibiotics Other (See Comments)  . Budesonide-Formoterol Fumarate Palpitations    VITALS:  Blood pressure (!) 164/89, pulse 75, temperature 97.7 F (36.5 C), temperature source Axillary, resp. rate 20, height 5' 5.5" (1.664 m), weight 57.7 kg (127 lb 1.6 oz), SpO2 100 %.  PHYSICAL EXAMINATION:  Physical Exam  GENERAL:  82 y.o.-year-old elderly patient lying in the bed with no acute distress.  EYES: Pupils equal, round, reactive to light and accommodation. No scleral icterus. Extraocular muscles intact.  HEENT: Head atraumatic, normocephalic. Oropharynx and nasopharynx clear.  NECK:  Supple, no jugular venous distention. No thyroid enlargement, no tenderness.  LUNGS: minimal expiratory wheezing only posteriorly, much improving, rhonchi at the bases. No crackles or crepitations. No use of accessory muscles of respiration.  CARDIOVASCULAR: S1, S2 normal. No  rubs, or gallops. Harsh 3/6 systolic murmur present ABDOMEN: Soft, nontender, nondistended. Bowel sounds present. No organomegaly or mass.  EXTREMITIES: No pedal edema, cyanosis, or clubbing.  NEUROLOGIC: Cranial nerves II through XII are intact. Muscle strength 5/5 in all extremities. Sensation intact. Gait not checked. Global weakness noted PSYCHIATRIC: The patient is alert and oriented x 3.  SKIN: No obvious rash, lesion, or ulcer.    LABORATORY PANEL:  CBC  Recent Labs Lab 07/24/16 0333  WBC 21.6*  HGB 11.4*  HCT 33.4*  PLT 319    ------------------------------------------------------------------------------------------------------------------  Chemistries   Recent Labs Lab 07/20/16 1807  07/25/16 0415  NA 138  < > 141  K 3.2*  < > 3.6  CL 97*  < > 102  CO2 28  < > 29  GLUCOSE 129*  < > 111*  BUN 47*  < > 46*  CREATININE 2.17*  < > 1.31*  CALCIUM 8.9  < > 8.9  MG  --   < > 2.0  AST 63*  --   --   ALT 28  --   --   ALKPHOS 71  --   --   BILITOT 1.7*  --   --   < > = values in this interval not displayed. ------------------------------------------------------------------------------------------------------------------  Cardiac Enzymes  Recent Labs Lab 07/20/16 1807  TROPONINI 0.08*   ------------------------------------------------------------------------------------------------------------------  RADIOLOGY:  Dg Chest 2 View  Result Date: 07/25/2016 CLINICAL DATA:  82 year old female with shortness of breath and cough. Hypertension. Sepsis. Subsequent encounter. EXAM: CHEST  2 VIEW COMPARISON:  07/23/2016 and 07/20/2016 chest x-ray. FINDINGS: Rotated exam. Right base consolidation unchanged. This may represent infectious infiltrate given the patient's clinical symptoms and will need to be followed until clearance to exclude underlying mass. There may be a small right-sided pleural effusion. Component of atelectasis contributing to mediastinal shift to the right felt to be less likely consideration. Distorted mediastinum secondary to rotation. IMPRESSION: Similar appearance of right base consolidation suggestive of infiltrate (possibly with small right pleural effusion) given patient's clinical symptoms. Rotated exam with distortion of mediastinal and cardiac silhouette. Electronically Signed   By: Lacy DuverneySteven  Olson M.D.   On: 07/25/2016 08:36    EKG:   Orders placed or performed during the hospital encounter of 07/20/16  . EKG 12-Lead  . EKG 12-Lead    ASSESSMENT AND PLAN:   [name] a 82  y.o.femalewho presents with Shortness of breath and increasing productive cough over the last few days. Here in the ED she was found to meet sepsis criteria with tachycardia, elevated white count and lactic acidosis. Chest x-ray showed pneumonia  1.Sepsis (HCC)-secondary to pneumonia.  -Iv rocephin and zithromax-Azithromycin for 5 days and Rocephin for 7 days total -negative cultures and WBC is elevated due to steroids -Flutter valve added and Mucinex, added tussionex  2. CAP - On antibiotics. Blood pressure is stable. -Cultures are negative  3.CHF- acute on chronic systolic - restarted lasix, CXR with pulmonary vascular congestion and pl effusion and PNA-' - ECHO with EF 20%, global hypokinesis and critical aortic stenosis - cardiology consulted for the same- not a surgical candidate and no change in management - on low dose ACEI and beta blocker  4.Anxiety-continue BuSpar. Also add Ativan as needed due to being on steroids  5.COPD exacerbation (chronic obstructive pulmonary disease) (HCC) - cont home meds and inhalers,  - Improving now- on oral prednisone  Also nebs added scheduled  6.A-fib (HCC) - rate controlled, cont home meds On coreg  7.GERD (gastroesophageal reflux disease) - home dose ppi  8. DVT prophylaxis- on SQ heparin  Physical Therapy consulted, family interested in rehab Anticipate discharge tomorrow Palliative care consulted for goals of treatment and care   All the records are reviewed and case discussed with Care Management/Social Workerr. Management plans discussed with the patient, family and they are in agreement.  CODE STATUS: full code   TOTAL TIME TAKING CARE OF  THIS PATIENT: 36 minutes.   POSSIBLE D/C TOMORROW, DEPENDING ON CLINICAL CONDITION.   Enid Baas M.D on 07/25/2016 at 12:58 PM  Between 7am to 6pm - Pager - [phone]  After 6pm go to [email or web address] - Social research officer, government  [facility]  Office   [phone]  CC: Primary care physician; Marisue Ivan, MD

## 2016-07-25 NOTE — Progress Notes (Signed)
Clinical Child psychotherapistocial Worker (CSW) attempt to contact patient's daughter Gigi Gineggy to give bed offers however she did not answer and a voicemail was left.   [employer]iley Chelan Heringer, LCSW [phone]

## 2016-07-25 NOTE — Progress Notes (Signed)
Physical Therapy Treatment Patient Details Name: Latasha Beck MRN: [id number] DOB: 06-May-1921 Today's Date: 07/25/2016    History of Present Illness Pt admitted for sepsis. Pt with complaints of SOB and cough. Pt with history of aortic stenosis, COPD, GERD, and HTN. Pt currently on room air with sats WNL.    PT Comments    Patient with decrease in activity tolerance and overall functional performance this date.  Unable to tolerate household distances or complete functional tasks that would facilitate safe discharge home alone due to noted deficits in cardiopulmonary endurance.  Patient globally weak and deconditioned; would benefit from continued rehab services to address.  Discharge recommendations updated to STR; however, question patient's ability to tolerate intensity of STR.  Will continue to monitor throughout stay. Patient persistently reporting subjective SOB, though sats remain >92% on RA throughout session; MD informed/aware.  Patient requesting replacement of supplemental O2 end of session for comfort.   Follow Up Recommendations  SNF     Equipment Recommendations  Rolling walker with 5" wheels    Recommendations for Other Services       Precautions / Restrictions Precautions Precautions: Fall Restrictions Weight Bearing Restrictions: No    Mobility  Bed Mobility Overal bed mobility: Needs Assistance Bed Mobility: Supine to Sit     Supine to sit: Min assist     General bed mobility comments: for truncal elevation  Transfers Overall transfer level: Needs assistance Equipment used: Rolling walker (2 wheeled) Transfers: Sit to/from Stand Sit to Stand: Min guard            Ambulation/Gait Ambulation/Gait assistance: Min guard;Min assist Ambulation Distance (Feet): 25 Feet Assistive device: Rolling walker (2 wheeled)       General Gait Details: forward flexed posture with decreased cadence, step height/length compared to previous date; min cuing for  walker position.  Refused further distance due to fatigue/SOB (though sats remain >95% on RA)   Stairs            Wheelchair Mobility    Modified Rankin (Stroke Patients Only)       Balance Overall balance assessment: Needs assistance Sitting-balance support: No upper extremity supported;Feet supported Sitting balance-Leahy Scale: Good     Standing balance support: Bilateral upper extremity supported Standing balance-Leahy Scale: Fair                      Cognition Arousal/Alertness: Awake/alert Behavior During Therapy: WFL for tasks assessed/performed Overall Cognitive Status:  (overall orientation improved from previous session, but with noted delay in processing and task initiation; mod encouragement for participation with session this date)                      Exercises Other Exercises Other Exercises: Sit/stand from additional seating surfaces (edge of bed without armrests, recliner), all performed with RW, cga/close sup. Does require use of UEs for lift off from all seating surfaces Other Exercises: 5x sit/stand, requiring UE support, completed in 55 seconds indicative of decreased functional strength/power in bilat LEs, increased fall risk.    General Comments        Pertinent Vitals/Pain Pain Assessment: No/denies pain    Home Living                      Prior Function            PT Goals (current goals can now be found in the care plan section) Acute Rehab PT Goals  Patient Stated Goal: to improve balance PT Goal Formulation: With patient Time For Goal Achievement: 08/07/16 Potential to Achieve Goals: Fair Progress towards PT goals: Progressing toward goals    Frequency    Min 2X/week      PT Plan Discharge plan needs to be updated    Co-evaluation             End of Session Equipment Utilized During Treatment: Gait belt Activity Tolerance: Patient limited by fatigue Patient left: in chair;with call  bell/phone within reach;with chair alarm set     Time: 4098-1191 PT Time Calculation (min) (ACUTE ONLY): 22 min  Charges:  $Gait Training: 8-22 mins                    G Codes:      Latasha Beck, PT, DPT, NCS 07/25/16, 2:53 PM [phone]

## 2016-07-25 NOTE — Progress Notes (Signed)
New referral for Palliative [facility] to [facility] following discharge received from CSW [facility]. [facility] referral made aware. Thank you. Dayna BarkerKaren Robertson RN, BSN, [facility], [facility] [phone] c

## 2016-07-25 NOTE — Progress Notes (Signed)
Clinical Education officer, museum (CSW) met with patient and her daughter Latasha Beck and granddaughter to provide bed offers. Daughter chose [facility] Per Palliative NP patient will go to [facility] with palliative following. Patient's daughter is agreeable for palliative care to follow. [facility] liaison is aware of above. Per MD patient will likely be ready for D/C tomorrow. CSW provided daughter with education about hospice VS. palliative care and long term care options. CSW also discussed private pay and Medicaid options. CSW will continue to follow and assist as needed.   [employer], LCSW [phone]

## 2016-07-25 NOTE — Progress Notes (Signed)
[facility]  SUBJECTIVE: I feel short of breath   Vitals:   07/24/16 2344 07/25/16 0400 07/25/16 0807 07/25/16 0827  BP: (!) 144/93 135/67 (!) 150/75 (!) 164/89  Pulse: 77 75 66 75  Resp: 16 18 20    Temp: 98 F (36.7 C) 98.7 F (37.1 C) 97.7 F (36.5 C)   TempSrc: Oral Oral Axillary   SpO2: 99% 98% 100%   Weight:      Height:         Intake/Output Summary (Last 24 hours) at 07/25/16 0850 Last data filed at 07/25/16 0005  Gross per 24 hour  Intake              120 ml  Output                0 ml  Net              120 ml      PHYSICAL EXAM  General: Well developed, well nourished, in no acute distress HEENT:  Normocephalic and atramatic Neck:  No JVD.  Lungs: Bilateral coarse rhonchi Heart: HRRR . Grade 3/6 crescendo decrescendo murmur Abdomen: Bowel sounds are positive, abdomen soft and non-tender  Msk:  Back normal, normal gait. Normal strength and tone for age. Extremities: No clubbing, cyanosis or edema.   Neuro: Alert and oriented X 3. Psych:  Good affect, responds appropriately   LABS: Basic Metabolic Panel:  Recent Labs  40/98/1109/25/17 0333 07/25/16 0415  NA 140 141  K 3.8 3.6  CL 100* 102  CO2 34* 29  GLUCOSE 159* 111*  BUN 40* 46*  CREATININE 1.33* 1.31*  CALCIUM 8.9 8.9  MG 2.0 2.0   Liver Function Tests: No results for input(s): AST, ALT, ALKPHOS, BILITOT, PROT, ALBUMIN in the last 72 hours. No results for input(s): LIPASE, AMYLASE in the last 72 hours. CBC:  Recent Labs  07/23/16 0415 07/24/16 0333  WBC 15.3* 21.6*  HGB 11.4* 11.4*  HCT 33.8* 33.4*  MCV 92.7 91.5  PLT 244 319   Cardiac Enzymes: No results for input(s): CKTOTAL, CKMB, CKMBINDEX, TROPONINI in the last 72 hours. BNP: Invalid input(s): POCBNP D-Dimer: No results for input(s): DDIMER in the last 72 hours. Hemoglobin A1C: No results for input(s): HGBA1C in the last 72 hours. Fasting Lipid Panel: No results for input(s): CHOL, HDL, LDLCALC, TRIG, CHOLHDL, LDLDIRECT  in the last 72 hours. Thyroid Function Tests: No results for input(s): TSH, T4TOTAL, T3FREE, THYROIDAB in the last 72 hours.  Invalid input(s): FREET3 Anemia Panel: No results for input(s): VITAMINB12, FOLATE, FERRITIN, TIBC, IRON, RETICCTPCT in the last 72 hours.  Dg Chest 2 View  Result Date: 07/25/2016 CLINICAL DATA:  82 year old female with shortness of breath and cough. Hypertension. Sepsis. Subsequent encounter. EXAM: CHEST  2 VIEW COMPARISON:  07/23/2016 and 07/20/2016 chest x-ray. FINDINGS: Rotated exam. Right base consolidation unchanged. This may represent infectious infiltrate given the patient's clinical symptoms and will need to be followed until clearance to exclude underlying mass. There may be a small right-sided pleural effusion. Component of atelectasis contributing to mediastinal shift to the right felt to be less likely consideration. Distorted mediastinum secondary to rotation. IMPRESSION: Similar appearance of right base consolidation suggestive of infiltrate (possibly with small right pleural effusion) given patient's clinical symptoms. Rotated exam with distortion of mediastinal and cardiac silhouette. Electronically Signed   By: Lacy DuverneySteven  Olson M.D.   On: 07/25/2016 08:36   Dg Chest 2 View  Result Date: 07/23/2016 CLINICAL DATA:  Pneumonia.  Shortness of breath. COPD. Hypertension. EXAM: CHEST  2 VIEW COMPARISON:  07/20/2016 FINDINGS: Osteopenia. Patient rotated right. Mild cardiomegaly. Probable trace bilateral pleural effusions. No pneumothorax. Increased pulmonary interstitial thickening, suggesting venous congestion. Persistent right base airspace disease. Mild subsegmental atelectasis in the left lung base. IMPRESSION: Similar right base airspace disease, favor infection or aspiration. Suspect developing pulmonary venous congestion. Electronically Signed   By: Jeronimo Greaves M.D.   On: 07/23/2016 09:56     Echo LV EF 20-25%, calculated aortic valve area 0.57 cm with peak  gradient 82 mmHg, mean gradient 46 mmHg  TELEMETRY: Atrial fibrillation:  ASSESSMENT AND PLAN:  Principal Problem:   Sepsis (HCC) Active Problems:   CAP (community acquired pneumonia)   HTN (hypertension)   GERD (gastroesophageal reflux disease)   COPD (chronic obstructive pulmonary disease) (HCC)   A-fib (HCC)   Acute on chronic renal failure (HCC)    1. Respiratory failure, primarily due to pneumonia 2. Severe aortic stenosis, not a candidate for AVR or TAVR 3. Dilated cardiomyopathy, on appropriate medical regimen 4. Syncope, unknown etiology, possibly related to aortic stenosis which is not amenable to surgical or percutaneous repair 5. Chronic atrial fibrillation, rate controlled  Recommendations  1. Agree with overall current therapy 2. Defer AVR or TAVR 3. Defer chronic anticoagulation 4. Continue current heart failure regimen  Sign off for now, please call if any questions   Nastashia Gallo, MD, PhD, [facility] 07/25/2016 8:50 AM

## 2016-07-25 NOTE — Consult Note (Signed)
Consultation Note Date: 07/25/2016   Patient Name: Latasha Beck  DOB: 1920/12/28  MRN: [id number]  Age / Sex: 82 y.o., female  PCP: Marisue Ivan, MD Referring Physician: Enid Baas, MD  Reason for Consultation: Establishing goals of care and Psychosocial/spiritual support  HPI/Patient Profile: 82 y.o. female  admitted on 07/20/2016 with  82 year old female referred for evaluation for aortic stenosis, cardiomyopathy, and presyncope. The patient was admitted 07/20/2016 for shortness of breath, chest x-ray consistent with pneumonia. The patient has known history of aortic stenosis, chronic exertional dyspnea, and intermittent episodes of presyncope. 2-D echocardiogram 07/22/2016 revealed LVEF of 20-25%, with calculated aortic valve area 0.57 cm, with mean gradient of 46 mmHg and peak gradient of 82 mmHg. Overall, the patient has shown slow progress with antibiotic therapy for pneumonia.  Patient lives alone, family are very concerned about her safety.   Patient has capacity to make her own decisions and is adamant about staying in her home.   I discussed in detail with the patient her critical AS and likelihood that she weill continue to fail to thrive, and importance of safety.  We discussed concept of mortality.  Patient is a retired Char[employer] fundraiser.  Patient and family are willing to add more in home assistance if it becomes necessary     Clinical Assessment and Goals of Care:    This NP Lorinda Creed reviewed medical records, received report from team, assessed the patient and then meet at the patient's bedside along with her daughter/HPOA/Peggy Payton Doughty and grand-daughter Raynelle Fanning  to discuss diagnosis, prognosis, GOC, EOL wishes disposition and options.  A detailed discussion was had today regarding advanced directives.  Concepts specific to code status, artifical feeding and hydration, continued IV antibiotics  and rehospitalization was had.  The difference between a aggressive medical intervention path  and a palliative comfort care path for this patient at this time was had.  Values and goals of care important to patient and family were attempted to be elicited.  MOST form introdueced  Concept of Hospice and Palliative Care were discussed  Natural trajectory and expectations at EOL were discussed.  Questions and concerns addressed.   Family encouraged to call with questions or concerns.  PMT will continue to support holistically.    SUMMARY OF RECOMMENDATIONS    Code Status/Advance Care Planning:  DNR    Symptom Management:   Weakness: PT as toelrated on discharge  Palliative Prophylaxis:   Aspiration, Bowel Regimen, Delirium Protocol and Frequent Pain Assessment  Additional Recommendations (Limitations, Scope, Preferences):  Avoid Hospitalization  Psycho-social/Spiritual:   Desire for further Chaplaincy support:no  Additional Recommendations: Education on Hospice  Prognosis:   < 6 months  Discharge Planning: Skilled Nursing Facility for rehab with Palliative care service follow-up      Primary Diagnoses: Present on Admission: . Sepsis (HCC) . CAP (community acquired pneumonia) . HTN (hypertension) . GERD (gastroesophageal reflux disease) . COPD (chronic obstructive pulmonary disease) (HCC) . A-fib (HCC) . Acute on chronic renal failure (HCC)   I have reviewed the  medical record, interviewed the patient and family, and examined the patient. The following aspects are pertinent.  Past Medical History:  Diagnosis Date  . Aortic stenosis   . Cataract   . COPD (chronic obstructive pulmonary disease) (HCC)   . GERD (gastroesophageal reflux disease)   . HTN (hypertension)   . Hypertension   . Hypothyroidism   . Vertigo    Social History   Social History  . Marital status: Widowed    Spouse name: N/A  . Number of children: N/A  . Years of education: N/A    Social History Main Topics  . Smoking status: Never Smoker  . Smokeless tobacco: Never Used  . Alcohol use No  . Drug use: No  . Sexual activity: Not Currently   Other Topics Concern  . None   Social History Narrative  . None   Family History  Problem Relation Age of Onset  . Heart disease Mother   . Stroke Father   . Diabetes Father   . Heart disease Brother    Scheduled Meds: . albuterol  3 mL Inhalation TID  . arformoterol  15 mcg Nebulization BID  . aspirin  81 mg Oral QODAY  . azithromycin  250 mg Oral Daily  . budesonide (PULMICORT) nebulizer solution  0.25 mg Nebulization BID  . busPIRone  20 mg Oral BID  . carvedilol  6.25 mg Oral BID WC  . cefTRIAXone (ROCEPHIN)  IV  1 g Intravenous Q24H  . chlorpheniramine-HYDROcodone  5 mL Oral Q12H  . escitalopram  10 mg Oral BH-q7a  . furosemide  40 mg Oral Daily  . guaiFENesin  600 mg Oral BID  . heparin  5,000 Units Subcutaneous Q8H  . levothyroxine  75 mcg Oral QAC breakfast  . lisinopril  5 mg Oral Daily  . mouth rinse  15 mL Mouth Rinse BID  . montelukast  10 mg Oral QPM  . pantoprazole  40 mg Oral BH-q7a  . potassium chloride  20 mEq Oral Daily  . predniSONE  50 mg Oral Q breakfast  . senna-docusate  1 tablet Oral BID  . sodium chloride flush  3 mL Intravenous Q12H  . tiotropium  18 mcg Inhalation BH-q7a   Continuous Infusions:  PRN Meds:.acetaminophen **OR** acetaminophen, albuterol, guaiFENesin-dextromethorphan, LORazepam, Melatonin, ondansetron **OR** ondansetron (ZOFRAN) IV, traMADol Medications Prior to Admission:  Prior to Admission medications   Medication Sig Start Date End Date Taking? Authorizing Provider  acetaminophen (TYLENOL) 500 MG tablet Take 1,000 mg by mouth 2 (two) times daily as needed for pain.   Yes Historical Provider, MD  albuterol (PROVENTIL HFA;VENTOLIN HFA) 108 (90 Base) MCG/ACT inhaler Inhale 2 puffs into the lungs every 4 (four) hours as needed for wheezing. 07/17/16  Yes Historical  Provider, MD  albuterol (PROVENTIL) (2.5 MG/3ML) 0.083% nebulizer solution Inhale 2.5 mg into the lungs every 6 (six) hours as needed for wheezing.   Yes Historical Provider, MD  aspirin 81 MG chewable tablet Chew 81 mg by mouth every other day.   Yes Historical Provider, MD  busPIRone (BUSPAR) 10 MG tablet Take 20 mg by mouth 2 (two) times daily. 07/14/16 07/14/17 Yes Historical Provider, MD  escitalopram (LEXAPRO) 10 MG tablet Take 10 mg by mouth every morning. 07/17/16  Yes Historical Provider, MD  fluticasone (FLOVENT HFA) 110 MCG/ACT inhaler Inhale 2 puffs into the lungs 2 (two) times daily. 01/14/16  Yes Historical Provider, MD  furosemide (LASIX) 40 MG tablet Take 40 mg by mouth every morning. 07/17/16  Yes Historical Provider, MD  L-Methylfolate-Algae (DEPLIN 15 PO) Take 15 mg by mouth every morning.   Yes Historical Provider, MD  levothyroxine (SYNTHROID, LEVOTHROID) 75 MCG tablet Take 75 mcg by mouth daily before breakfast. 07/11/16  Yes Historical Provider, MD  meclizine (ANTIVERT) 12.5 MG tablet Take 12.5 mg by mouth 3 (three) times daily as needed. For inner ear. 05/18/15  Yes Historical Provider, MD  Melatonin 3 MG TABS Take 6 mg by mouth at bedtime. 06/30/14  Yes Historical Provider, MD  montelukast (SINGULAIR) 10 MG tablet Take 10 mg by mouth every evening. At 4 pm. 07/06/15  Yes Historical Provider, MD  Omega-3 Fatty Acids (FISH OIL PO) Take 1 capsule by mouth 2 (two) times daily.   Yes Historical Provider, MD  pantoprazole (PROTONIX) 40 MG tablet Take 40 mg by mouth every morning. 04/05/16  Yes Historical Provider, MD  raloxifene (EVISTA) 60 MG tablet Take 60 mg by mouth every morning. 03/17/16  Yes Historical Provider, MD  salmeterol (SEREVENT DISKUS) 50 MCG/DOSE diskus inhaler Inhale 1 puff into the lungs every 12 (twelve) hours. 04/03/16  Yes Historical Provider, MD  senna-docusate (SENOKOT-S) 8.6-50 MG tablet Take 1 tablet by mouth 2 (two) times daily as needed for constipation.   Yes  Historical Provider, MD  tiotropium (SPIRIVA) 18 MCG inhalation capsule Place 18 mcg into inhaler and inhale every morning. 03/13/16  Yes Historical Provider, MD  traMADol (ULTRAM) 50 MG tablet Take 50 mg by mouth every 6 (six) hours as needed for pain. 07/02/15  Yes Historical Provider, MD  traZODone (DESYREL) 100 MG tablet Take 200 mg by mouth at bedtime. 07/11/16  Yes Historical Provider, MD   Allergies  Allergen Reactions  . Amoxicillin Nausea Only    Has patient had a PCN reaction causing immediate rash, facial/tongue/throat swelling, SOB or lightheadedness with hypotension: No Has patient had a PCN reaction causing severe rash involving mucus membranes or skin necrosis: No Has patient had a PCN reaction that required hospitalization No Has patient had a PCN reaction occurring within the last 10 years: Yes If all of the above answers are "NO", then may proceed with Cephalosporin use.  Marland Kitchen. Amoxicillin-Pot Clavulanate     Other reaction(s): Other (See Comments) Gi upset  . Aspirin     Other reaction(s): Other (See Comments) In large doses - swelling of eyes  . Cephalexin     Other reaction(s): Other (See Comments) GI upset  . Ciprofloxacin     Other reaction(s): Other (See Comments) GI upset  . Hydrocodone Nausea Only  . Levofloxacin     Other reaction(s): Other (See Comments) GI upset  . Metoclopramide     Other reaction(s): Other (See Comments) Other Reaction: Not Assessed  . Nabumetone     Other reaction(s): Unknown  . Rosuvastatin     Other reaction(s): Muscle Pain  . Sulfa Antibiotics Other (See Comments)  . Budesonide-Formoterol Fumarate Palpitations   Review of Systems  Constitutional: Positive for activity change and fatigue.  Respiratory: Positive for shortness of breath.   Neurological: Positive for dizziness and weakness.    Physical Exam  Constitutional:  -elderly, frail  Cardiovascular: Normal rate and regular rhythm.   Pulmonary/Chest: She has decreased  breath sounds in the right lower field and the left lower field.  Musculoskeletal:  -generalized weakness  Neurological: She is alert.  Skin: Skin is warm and dry.  -noted skin breakdown on sacrum, per EMR    Vital Signs: BP (!) 164/89 (BP Location: Left Arm)  Pulse 75   Temp 97.7 F (36.5 C) (Axillary)   Resp 20   Ht 5' 5.5" (1.664 m)   Wt 57.7 kg (127 lb 1.6 oz)   SpO2 100%   BMI 20.83 kg/m  Pain Assessment: No/denies pain   Pain Score: 0-No pain   SpO2: SpO2: 100 % O2 Device:SpO2: 100 % O2 Flow Rate: .O2 Flow Rate (L/min): 2 L/min  IO: Intake/output summary:  Intake/Output Summary (Last 24 hours) at 07/25/16 1329 Last data filed at 07/25/16 0800  Gross per 24 hour  Intake              240 ml  Output                0 ml  Net              240 ml    LBM: Last BM Date: 07/24/16 Baseline Weight: Weight: 59 kg (130 lb) Most recent weight: Weight: 57.7 kg (127 lb 1.6 oz)      Palliative Assessment/Data:  30% at best   Discussed with Fredric Mare LCSW  Time In: 1230 Time Out: 1400 Time Total: 90 min Greater than 50%  of this time was spent counseling and coordinating care related to the above assessment and plan.  Signed by: Lorinda Creed, NP   Please contact Palliative Medicine Team phone at [phone] for questions and concerns.  For individual provider: See Loretha Stapler

## 2016-07-25 NOTE — Progress Notes (Signed)
MEDICATION RELATED CONSULT NOTE -follow up  Pharmacy Consult for electrolyte replacement Indication: hypokalemia   Patient Measurements: Height: 5' 5.5" (166.4 cm) Weight: 127 lb 1.6 oz (57.7 kg) IBW/kg (Calculated) : 58.15 Adjusted Body Weight:    Vital Signs: Temp: 97.7 F (36.5 C) (09/26 0807) Temp Source: Axillary (09/26 0807) BP: 164/89 (09/26 0827) Pulse Rate: 75 (09/26 0827) Intake/Output from previous day: 09/25 0701 - 09/26 0700 In: 120 [P.O.:120] Out: 0  Intake/Output from this shift: Total I/O In: 240 [P.O.:240] Out: -   Labs:  Recent Labs  07/23/16 0415 07/24/16 0333 07/25/16 0415  WBC 15.3* 21.6*  --   HGB 11.4* 11.4*  --   HCT 33.8* 33.4*  --   PLT 244 319  --   CREATININE 1.18* 1.33* 1.31*  MG 2.2 2.0 2.0   Estimated Creatinine Clearance: 24.4 mL/min (by C-G formula based on SCr of 1.31 mg/dL (H)).   Medical History: Past Medical History:  Diagnosis Date  . Aortic stenosis   . Cataract   . COPD (chronic obstructive pulmonary disease) (HCC)   . GERD (gastroesophageal reflux disease)   . HTN (hypertension)   . Hypertension   . Hypothyroidism   . Vertigo     Medications:  Scheduled:  . albuterol  3 mL Inhalation TID  . arformoterol  15 mcg Nebulization BID  . aspirin  81 mg Oral QODAY  . azithromycin  250 mg Oral Daily  . budesonide (PULMICORT) nebulizer solution  0.25 mg Nebulization BID  . busPIRone  20 mg Oral BID  . carvedilol  6.25 mg Oral BID WC  . cefTRIAXone (ROCEPHIN)  IV  1 g Intravenous Q24H  . chlorpheniramine-HYDROcodone  5 mL Oral Q12H  . escitalopram  10 mg Oral BH-q7a  . furosemide  40 mg Oral Daily  . guaiFENesin  600 mg Oral BID  . heparin  5,000 Units Subcutaneous Q8H  . levothyroxine  75 mcg Oral QAC breakfast  . lisinopril  5 mg Oral Daily  . mouth rinse  15 mL Mouth Rinse BID  . montelukast  10 mg Oral QPM  . pantoprazole  40 mg Oral BH-q7a  . potassium chloride  20 mEq Oral Daily  . predniSONE  50 mg Oral  Q breakfast  . senna-docusate  1 tablet Oral BID  . sodium chloride flush  3 mL Intravenous Q12H  . tiotropium  18 mcg Inhalation BH-q7a    Assessment: 82yo female admitted for sepsis/PNA now with hypokalemia.   Currently ordered furosemide 40mg  PO daily and potassium 20meq PO daily  Goal of Therapy:  Maintain electrolytes within normal range  Plan:  Electrolytes WNL for several days. Will continue current orders and recheck potassium on 9/28  Garlon HatchetJody Rhealyn Cullen, PharmD Clinical Pharmacist  07/25/2016 2:38 PM

## 2016-07-26 ENCOUNTER — Encounter
Admission: RE | Admit: 2016-07-26 | Discharge: 2016-07-26 | Disposition: A | Discharge status: Home / Self Care | Payer: Medicare Other | Source: Ambulatory Visit | Attending: Internal Medicine | Admitting: Internal Medicine

## 2016-07-26 DIAGNOSIS — Z66 Do not resuscitate: Secondary | ICD-10-CM

## 2016-07-26 DIAGNOSIS — Z515 Encounter for palliative care: Secondary | ICD-10-CM

## 2016-07-26 DIAGNOSIS — I509 Heart failure, unspecified: Secondary | ICD-10-CM

## 2016-07-26 DIAGNOSIS — M6281 Muscle weakness (generalized): Secondary | ICD-10-CM

## 2016-07-26 LAB — BASIC METABOLIC PANEL WITH GFR
Anion gap: 8 (ref 5–15)
BUN: 46 mg/dL — ABNORMAL HIGH (ref 6–20)
CO2: 31 mmol/L (ref 22–32)
Calcium: 8.8 mg/dL — ABNORMAL LOW (ref 8.9–10.3)
Chloride: 102 mmol/L (ref 101–111)
Creatinine, Ser: 1.32 mg/dL — ABNORMAL HIGH (ref 0.44–1.00)
GFR calc Af Amer: 39 mL/min — ABNORMAL LOW (ref >60)
GFR calc non Af Amer: 34 mL/min — ABNORMAL LOW (ref >60)
Glucose, Bld: 104 mg/dL — ABNORMAL HIGH (ref 65–99)
Potassium: 3.5 mmol/L (ref 3.5–5.1)
Sodium: 141 mmol/L (ref 135–145)

## 2016-07-26 LAB — CBC
HCT: 37.4 % (ref 35.0–47.0)
Hemoglobin: 12.2 g/dL (ref 12.0–16.0)
MCH: 30.3 pg (ref 26.0–34.0)
MCHC: 32.6 g/dL (ref 32.0–36.0)
MCV: 92.9 fL (ref 80.0–100.0)
Platelets: 353 K/uL (ref 150–440)
RBC: 4.02 MIL/uL (ref 3.80–5.20)
RDW: 14.6 % — ABNORMAL HIGH (ref 11.5–14.5)
WBC: 18.8 K/uL — ABNORMAL HIGH (ref 3.6–11.0)

## 2016-07-26 MED ORDER — PREDNISONE 10 MG (21) PO TBPK: 10.0000 mg | ORAL_TABLET | Freq: Every day | ORAL | 0 refills | Status: AC

## 2016-07-26 MED ORDER — LISINOPRIL 5 MG PO TABS: 5.0000 mg | ORAL_TABLET | Freq: Every day | ORAL | 0 refills | Status: AC

## 2016-07-26 MED ORDER — POTASSIUM CHLORIDE CRYS ER 20 MEQ PO TBCR: 20.0000 meq | EXTENDED_RELEASE_TABLET | Freq: Every day | ORAL | 2 refills | Status: AC

## 2016-07-26 MED ORDER — TRAMADOL HCL 50 MG PO TABS: 50.0000 mg | ORAL_TABLET | Freq: Four times a day (QID) | ORAL | 0 refills | Status: AC | PRN

## 2016-07-26 MED ORDER — HYDROCOD POLST-CPM POLST ER 10-8 MG/5ML PO SUER: 5.0000 mL | Freq: Two times a day (BID) | ORAL | 0 refills | Status: AC

## 2016-07-26 MED ORDER — ACETAMINOPHEN 325 MG PO TABS: 650.0000 mg | ORAL_TABLET | Freq: Four times a day (QID) | ORAL | 0 refills | Status: AC | PRN

## 2016-07-26 MED ORDER — CARVEDILOL 6.25 MG PO TABS: 6.2500 mg | ORAL_TABLET | Freq: Two times a day (BID) | ORAL | 2 refills | Status: AC

## 2016-07-26 MED ORDER — CEFUROXIME AXETIL 500 MG PO TABS: 500.0000 mg | ORAL_TABLET | Freq: Two times a day (BID) | ORAL | 0 refills | Status: AC

## 2016-07-26 MED ORDER — GUAIFENESIN-DM 100-10 MG/5ML PO SYRP: 5.0000 mL | ORAL_SOLUTION | ORAL | 0 refills | Status: AC | PRN

## 2016-07-26 MED ORDER — LORAZEPAM 0.5 MG PO TABS: 0.5000 mg | ORAL_TABLET | Freq: Three times a day (TID) | ORAL | 0 refills | Status: AC

## 2016-07-26 NOTE — Clinical Social Work Placement (Signed)
   CLINICAL SOCIAL WORK PLACEMENT  NOTE  Date:  07/26/2016  Patient Details  Name: Latasha GuernseyOpal P Andrew MRN: [id number] Date of Birth: 15-Feb-1921  Clinical Social Work is seeking post-discharge placement for this patient at the Skilled  Nursing Facility level of care (*CSW will initial, date and re-position this form in  chart as items are completed):  Yes   Patient/family provided with [facility] Clinical Social Work Department's list of facilities offering this level of care within the geographic area requested by the patient (or if unable, by the patient's family).  Yes   Patient/family informed of their freedom to choose among providers that offer the needed level of care, that participate in Medicare, Medicaid or managed care program needed by the patient, have an available bed and are willing to accept the patient.  Yes   Patient/family informed of [place] [facility]'s ownership interest in [facility] and [facility], as well as of the fact that they are under no obligation to receive care at these facilities.  PASRR submitted to EDS on 07/24/16     PASRR number received on 07/24/16     Existing PASRR number confirmed on       FL2 transmitted to all facilities in geographic area requested by pt/family on 07/24/16     FL2 transmitted to all facilities within larger geographic area on       Patient informed that his/her managed care company has contracts with or will negotiate with certain facilities, including the following:        Yes   Patient/family informed of bed offers received.  Patient chooses bed at  [facility] )     Physician recommends and patient chooses bed at      Patient to be transferred to  [facility] ) on 07/26/16.  Patient to be transferred to facility by  [facility] )     Patient family notified on 07/26/16 of transfer.  Name of family member notified:   (Patient's daughter Gigi Gineggy is at bedisde and aware of D/C today. )      PHYSICIAN       Additional Comment:    _______________________________________________ Nathalie Cavendish, Darleen CrockerBailey M, LCSW 07/26/2016, 2:07 PM

## 2016-07-26 NOTE — Progress Notes (Signed)
[facility]. Report was called to [employer] RN, all questions answered. PIV was removed. Discharge packet was given to pt along with all needed prescriptions. She is leaving with all her belongings. Family and pt are in agreement with plan. She will be transported via EMS.

## 2016-07-26 NOTE — Progress Notes (Signed)
Patient is medically stable [facility]. [facility] to room [facility]A. RN will call report at [phone] and arrange EMS for transport. Clinical Child psychotherapistocial Worker (CSW) sent D/C orders to [facility] via [facility] today. Patient will have palliative care to follow at facility. Patient is aware of above. Patient's daughter Gigi Gineggy is at bedside and aware of above. Please reconsult if future social work needs arise. CSW signing off.   [facility]Bailey Jobani Sabado, LCSW [phone]

## 2016-07-26 NOTE — Discharge Summary (Signed)
[facility] [place] [facility]   PATIENT NAME: Latasha Beck    MR#:  [id number]  DATE OF BIRTH:  [birth year]  DATE OF ADMISSION:  07/20/2016   ADMITTING PHYSICIAN: Oralia Manis, MD  DATE OF DISCHARGE: 07/26/16  PRIMARY CARE PHYSICIAN: Marisue Ivan, MD   ADMISSION DIAGNOSIS:   CAP (community acquired pneumonia) [J18.9]  DISCHARGE DIAGNOSIS:   Principal Problem:   Sepsis (HCC) Active Problems:   CAP (community acquired pneumonia)   HTN (hypertension)   GERD (gastroesophageal reflux disease)   COPD (chronic obstructive pulmonary disease) (HCC)   A-fib (HCC)   Acute on chronic renal failure (HCC)   CHF (congestive heart failure) (HCC)   Muscle weakness (generalized)   DNR (do not resuscitate)   Palliative care by specialist   SECONDARY DIAGNOSIS:   Past Medical History:  Diagnosis Date  . Aortic stenosis   . Cataract   . COPD (chronic obstructive pulmonary disease) (HCC)   . GERD (gastroesophageal reflux disease)   . HTN (hypertension)   . Hypertension   . Hypothyroidism   . Vertigo     HOSPITAL COURSE:   [name] a 82 y.o.femalewho presents with Shortness of breath and increasing productive cough over the last few days. Here in the ED she was found to meet sepsis criteria with tachycardia, elevated white count and lactic acidosis. Chest x-ray showed pneumonia  1.Sepsis (HCC)-secondary to pneumonia.  -received  rocephin and zithromax-will finish off course with ceftin in 2 days -negative cultures and WBC is elevated due to steroids -added tussionex  2.CAP - On antibiotics. Blood pressure is stable. -Cultures are negative  3.CHF- acute on chronic systolic - on lasix, CXR with pulmonary vascular congestion and pl effusion and PNA-' - ECHO with EF 20%, global hypokinesis and critical aortic stenosis - cardiology consulted for the same- not a surgical candidate and no change in management - on low dose ACEI and beta  blocker - poor prognosis due to her cardiomyopathy and also critical aortic stenosis  4.Anxiety-continue BuSpar. Also add Ativan as needed as very anxious  5.COPD exacerbation (chronic obstructive pulmonary disease) (HCC) - cont home meds and inhalers,  - Improving now- on oral prednisone taper  Also nebs and inahlers  6.A-fib (HCC) - rate controlled, cont home meds On coreg. No anticoagulation other than aspirin  7.GERD (gastroesophageal reflux disease) - home dose ppi  Physical Therapy consulted, family interested in rehab Palliative care consulted for goals of treatment and care- even at rehab Eventually might need hospise if does not improve  DISCHARGE CONDITIONS:   Guarded  CONSULTS OBTAINED:   Treatment Team:  Marcina Millard, MD  DRUG ALLERGIES:   Allergies  Allergen Reactions  . Amoxicillin Nausea Only    Has patient had a PCN reaction causing immediate rash, facial/tongue/throat swelling, SOB or lightheadedness with hypotension: No Has patient had a PCN reaction causing severe rash involving mucus membranes or skin necrosis: No Has patient had a PCN reaction that required hospitalization No Has patient had a PCN reaction occurring within the last 10 years: Yes If all of the above answers are "NO", then may proceed with Cephalosporin use.  Marland Kitchen Amoxicillin-Pot Clavulanate     Other reaction(s): Other (See Comments) Gi upset  . Aspirin     Other reaction(s): Other (See Comments) In large doses - swelling of eyes  . Cephalexin     Other reaction(s): Other (See Comments) GI upset  . Ciprofloxacin     Other reaction(s): Other (See  Comments) GI upset  . Hydrocodone Nausea Only  . Levofloxacin     Other reaction(s): Other (See Comments) GI upset  . Metoclopramide     Other reaction(s): Other (See Comments) Other Reaction: Not Assessed  . Nabumetone     Other reaction(s): Unknown  . Rosuvastatin     Other reaction(s): Muscle Pain  . Sulfa  Antibiotics Other (See Comments)  . Budesonide-Formoterol Fumarate Palpitations   DISCHARGE MEDICATIONS:     Medication List    STOP taking these medications   DEPLIN 15 PO   meclizine 12.5 MG tablet Commonly known as:  ANTIVERT   traZODone 100 MG tablet Commonly known as:  DESYREL     TAKE these medications   acetaminophen 325 MG tablet Commonly known as:  TYLENOL Take 2 tablets (650 mg total) by mouth every 6 (six) hours as needed for mild pain (or Fever >/= 101). What changed:  medication strength  how much to take  when to take this  reasons to take this   albuterol (2.5 MG/3ML) 0.083% nebulizer solution Commonly known as:  PROVENTIL Inhale 2.5 mg into the lungs every 6 (six) hours as needed for wheezing.   albuterol 108 (90 Base) MCG/ACT inhaler Commonly known as:  PROVENTIL HFA;VENTOLIN HFA Inhale 2 puffs into the lungs every 4 (four) hours as needed for wheezing.   aspirin 81 MG chewable tablet Chew 81 mg by mouth every other day.   busPIRone 10 MG tablet Commonly known as:  BUSPAR Take 20 mg by mouth 2 (two) times daily.   carvedilol 6.25 MG tablet Commonly known as:  COREG Take 1 tablet (6.25 mg total) by mouth 2 (two) times daily with a meal.   cefUROXime 500 MG tablet Commonly known as:  CEFTIN Take 1 tablet (500 mg total) by mouth 2 (two) times daily with a meal. X 2 more days   chlorpheniramine-HYDROcodone 10-8 MG/5ML Suer Commonly known as:  TUSSIONEX Take 5 mLs by mouth every 12 (twelve) hours.   escitalopram 10 MG tablet Commonly known as:  LEXAPRO Take 10 mg by mouth every morning.   FISH OIL PO Take 1 capsule by mouth 2 (two) times daily.   fluticasone 110 MCG/ACT inhaler Commonly known as:  FLOVENT HFA Inhale 2 puffs into the lungs 2 (two) times daily.   furosemide 40 MG tablet Commonly known as:  LASIX Take 40 mg by mouth every morning.   guaiFENesin-dextromethorphan 100-10 MG/5ML syrup Commonly known as:  ROBITUSSIN  DM Take 5 mLs by mouth every 4 (four) hours as needed for cough.   levothyroxine 75 MCG tablet Commonly known as:  SYNTHROID, LEVOTHROID Take 75 mcg by mouth daily before breakfast.   lisinopril 5 MG tablet Commonly known as:  PRINIVIL,ZESTRIL Take 1 tablet (5 mg total) by mouth daily. Start taking on:  07/27/2016   LORazepam 0.5 MG tablet Commonly known as:  ATIVAN Take 1 tablet (0.5 mg total) by mouth every 8 (eight) hours.   Melatonin 3 MG Tabs Take 6 mg by mouth at bedtime.   montelukast 10 MG tablet Commonly known as:  SINGULAIR Take 10 mg by mouth every evening. At 4 pm.   pantoprazole 40 MG tablet Commonly known as:  PROTONIX Take 40 mg by mouth every morning.   potassium chloride SA 20 MEQ tablet Commonly known as:  K-DUR,KLOR-CON Take 1 tablet (20 mEq total) by mouth daily. Start taking on:  07/27/2016   predniSONE 10 MG (21) Tbpk tablet Commonly known as:  STERAPRED  UNI-PAK 21 TAB Take 1 tablet (10 mg total) by mouth daily. 5 tabs PO x 1 day 4 tabs PO x 1 day 3 tabs PO x 1 day 2 tabs PO x 1 day 1 tab PO x 1 day and stop   raloxifene 60 MG tablet Commonly known as:  EVISTA Take 60 mg by mouth every morning.   senna-docusate 8.6-50 MG tablet Commonly known as:  Senokot-S Take 1 tablet by mouth 2 (two) times daily as needed for constipation.   SEREVENT DISKUS 50 MCG/DOSE diskus inhaler Generic drug:  salmeterol Inhale 1 puff into the lungs every 12 (twelve) hours.   tiotropium 18 MCG inhalation capsule Commonly known as:  SPIRIVA Place 18 mcg into inhaler and inhale every morning.   traMADol 50 MG tablet Commonly known as:  ULTRAM Take 1 tablet (50 mg total) by mouth every 6 (six) hours as needed. What changed:  reasons to take this        DISCHARGE INSTRUCTIONS:   1. PCP f/u in 1-2 weeks 2. Palliative care consult  DIET:   Cardiac diet  ACTIVITY:   Activity as tolerated  OXYGEN:   Home Oxygen: Yes.    Oxygen Delivery: 2 liters/min via  Patient connected to nasal cannula oxygen  DISCHARGE LOCATION:   nursing home   If you experience worsening of your admission symptoms, develop shortness of breath, life threatening emergency, suicidal or homicidal thoughts you must seek medical attention immediately by calling 911 or calling your MD immediately  if symptoms less severe.  You Must read complete instructions/literature along with all the possible adverse reactions/side effects for all the Medicines you take and that have been prescribed to you. Take any new Medicines after you have completely understood and accpet all the possible adverse reactions/side effects.   Please note  You were cared for by a hospitalist during your hospital stay. If you have any questions about your discharge medications or the care you received while you were in the hospital after you are discharged, you can call the unit and asked to speak with the hospitalist on call if the hospitalist that took care of you is not available. Once you are discharged, your primary care physician will handle any further medical issues. Please note that NO REFILLS for any discharge medications will be authorized once you are discharged, as it is imperative that you return to your primary care physician (or establish a relationship with a primary care physician if you do not have one) for your aftercare needs so that they can reassess your need for medications and monitor your lab values.    On the day of Discharge:  VITAL SIGNS:   Blood pressure (!) 145/87, pulse 63, temperature 98.1 F (36.7 C), temperature source Oral, resp. rate 17, height 5' 5.5" (1.664 m), weight 57.7 kg (127 lb 1.6 oz), SpO2 94 %.  PHYSICAL EXAMINATION:    GENERAL:  82 y.o.-year-old elderly patient lying in the bed with no acute distress.  EYES: Pupils equal, round, reactive to light and accommodation. No scleral icterus. Extraocular muscles intact.  HEENT: Head atraumatic, normocephalic.  Oropharynx and nasopharynx clear.  NECK:  Supple, no jugular venous distention. No thyroid enlargement, no tenderness.  LUNGS: minimal expiratory wheezing only posteriorly, much improving, rhonchi at the bases. No crackles or crepitations. No use of accessory muscles of respiration.  CARDIOVASCULAR: S1, S2 normal. No  rubs, or gallops. Harsh 3/6 systolic murmur present ABDOMEN: Soft, nontender, nondistended. Bowel sounds present. No organomegaly  or mass.  EXTREMITIES: No pedal edema, cyanosis, or clubbing.  NEUROLOGIC: Cranial nerves II through XII are intact. Muscle strength 5/5 in all extremities. Sensation intact. Gait not checked. Global weakness noted PSYCHIATRIC: The patient is alert and oriented x 3. Very anxious SKIN: No obvious rash, lesion, or ulcer.   DATA REVIEW:   CBC  Recent Labs Lab 07/26/16 0421  WBC 18.8*  HGB 12.2  HCT 37.4  PLT 353    Chemistries   Recent Labs Lab 07/20/16 1807  07/25/16 0415 07/26/16 0421  NA 138  < > 141 141  K 3.2*  < > 3.6 3.5  CL 97*  < > 102 102  CO2 28  < > 29 31  GLUCOSE 129*  < > 111* 104*  BUN 47*  < > 46* 46*  CREATININE 2.17*  < > 1.31* 1.32*  CALCIUM 8.9  < > 8.9 8.8*  MG  --   < > 2.0  --   AST 63*  --   --   --   ALT 28  --   --   --   ALKPHOS 71  --   --   --   BILITOT 1.7*  --   --   --   < > = values in this interval not displayed.   Microbiology Results  Results for orders placed or performed during the hospital encounter of 07/20/16  CULTURE, BLOOD (ROUTINE X 2) w Reflex to ID Panel     Status: None   Collection Time: 07/20/16  6:21 PM  Result Value Ref Range Status   Specimen Description BLOOD  LEFT AC  Final   Special Requests   Final    BOTTLES DRAWN AEROBIC AND ANAEROBIC  AER6CC ANA 8CC   Culture NO GROWTH 5 DAYS  Final   Report Status 07/25/2016 FINAL  Final  CULTURE, BLOOD (ROUTINE X 2) w Reflex to ID Panel     Status: None   Collection Time: 07/20/16  6:21 PM  Result Value Ref Range Status    Specimen Description BLOOD  LEFT WRIST  Final   Special Requests   Final    BOTTLES DRAWN AEROBIC AND ANAEROBIC  AER 10CC ANA9CC   Culture NO GROWTH 5 DAYS  Final   Report Status 07/25/2016 FINAL  Final  Urine culture     Status: None   Collection Time: 07/20/16 11:23 PM  Result Value Ref Range Status   Specimen Description URINE, RANDOM  Final   Special Requests NONE  Final   Culture NO GROWTH Performed at [facility]   Final   Report Status 07/22/2016 FINAL  Final    RADIOLOGY:  No results found.   Management plans discussed with the patient, family and they are in agreement.  CODE STATUS:     Code Status Orders        Start     Ordered   07/20/16 2223  Do not attempt resuscitation (DNR)  Continuous    Question Answer Comment  In the event of cardiac or respiratory ARREST Do not call a "code blue"   In the event of cardiac or respiratory ARREST Do not perform Intubation, CPR, defibrillation or ACLS   In the event of cardiac or respiratory ARREST Use medication by any route, position, wound care, and other measures to relive pain and suffering. May use oxygen, suction and manual treatment of airway obstruction as needed for comfort.      07/20/16 2222  Code Status History    Date Active Date Inactive Code Status Order ID Comments User Context   07/20/2016 10:22 PM 07/21/2016  9:10 AM DNR [id number]  Oralia Manis, MD Inpatient    Questions for Most Recent Historical Code Status (Order [id number])    Question Answer Comment   In the event of cardiac or respiratory ARREST Do not call a "code blue"    In the event of cardiac or respiratory ARREST Do not perform Intubation, CPR, defibrillation or ACLS    In the event of cardiac or respiratory ARREST Use medication by any route, position, wound care, and other measures to relive pain and suffering. May use oxygen, suction and manual treatment of airway obstruction as needed for comfort.         Advance Directive  Documentation   Flowsheet Row Most Recent Value  Type of Advance Directive  Living will  Pre-existing out of facility DNR order (yellow form or pink MOST form)  No data  "MOST" Form in Place?  No data      TOTAL TIME TAKING CARE OF THIS PATIENT: 37 minutes.    Javyn Havlin M.D on 07/26/2016 at 1:25 PM  Between 7am to 6pm - Pager - [phone]  After 6pm go to [email or web address] - Social research officer, government  [facility]  Office  [phone]  CC: Primary care physician; Marisue Ivan, MD   Note: This dictation was prepared with Dragon dictation along with smaller phrase technology. Any transcriptional errors that result from this process are unintentional.

## 2016-07-30 ENCOUNTER — Encounter
Admission: RE | Admit: 2016-07-30 | Discharge: 2016-07-30 | Disposition: A | Discharge status: Home / Self Care | Payer: Medicare Other | Source: Ambulatory Visit | Attending: Internal Medicine | Admitting: Internal Medicine

## 2016-08-01 DIAGNOSIS — K219 Gastro-esophageal reflux disease without esophagitis: Secondary | ICD-10-CM | POA: Diagnosis not present

## 2016-08-01 DIAGNOSIS — Z515 Encounter for palliative care: Secondary | ICD-10-CM | POA: Diagnosis not present

## 2016-08-01 DIAGNOSIS — I482 Chronic atrial fibrillation: Secondary | ICD-10-CM | POA: Diagnosis not present

## 2016-08-01 DIAGNOSIS — I350 Nonrheumatic aortic (valve) stenosis: Secondary | ICD-10-CM | POA: Diagnosis not present

## 2016-08-01 DIAGNOSIS — J449 Chronic obstructive pulmonary disease, unspecified: Secondary | ICD-10-CM | POA: Diagnosis not present

## 2016-08-01 DIAGNOSIS — Z66 Do not resuscitate: Secondary | ICD-10-CM

## 2016-08-01 DIAGNOSIS — E785 Hyperlipidemia, unspecified: Secondary | ICD-10-CM | POA: Diagnosis not present

## 2016-08-01 DIAGNOSIS — I429 Cardiomyopathy, unspecified: Secondary | ICD-10-CM

## 2016-08-01 DIAGNOSIS — I129 Hypertensive chronic kidney disease with stage 1 through stage 4 chronic kidney disease, or unspecified chronic kidney disease: Secondary | ICD-10-CM | POA: Diagnosis not present

## 2016-08-01 DIAGNOSIS — I509 Heart failure, unspecified: Secondary | ICD-10-CM | POA: Diagnosis not present

## 2016-08-01 DIAGNOSIS — E039 Hypothyroidism, unspecified: Secondary | ICD-10-CM | POA: Diagnosis not present

## 2016-08-01 DIAGNOSIS — N189 Chronic kidney disease, unspecified: Secondary | ICD-10-CM | POA: Diagnosis not present

## 2016-08-02 ENCOUNTER — Non-Acute Institutional Stay (SKILLED_NURSING_FACILITY): Payer: Medicare Other | Admitting: Gerontology

## 2016-08-02 DIAGNOSIS — J189 Pneumonia, unspecified organism: Secondary | ICD-10-CM

## 2016-08-03 NOTE — Progress Notes (Signed)
Location:      Place of Service:  SNF (31) Provider:  Lorenso Quarry, NP-C  Marisue Ivan, MD  Patient Care Team: Marisue Ivan, MD as PCP - General (Family Medicine)  Extended Emergency Contact Information Primary Emergency Contact: Mila Merry Address: [address]          [place], [place] [address] D[place] of [place] Home Phone: [phone] Mobile Phone: [phone] Relation: Daughter Secondary Emergency Contact: Livingston,Linda Address: [address]          [place], [place] [address] [place] of [place] Home Phone: [phone] Mobile Phone: [phone] Relation: Friend  Code Status:  dnr Goals of care: Advanced Directive information Advanced Directives 07/20/2016  Does patient have an advance directive? Yes  Type of Advance Directive Living will  Copy of advanced directive(s) in chart? No - copy requested  Would patient like information on creating an advanced directive? -     Chief Complaint  Patient presents with  . Follow-up    HPI:  Pt is a 82 y.o. female seen today for a hospital f/u s/p admission from [facility] for sepsis, community acquired pneumonia. Pt is maintaining her oxygen levels, intermittent, non-productive cough. Afebrile. Sleeping well. Denies pain and dyspnea. VSS. No other complaints  Past Medical History:  Diagnosis Date  . Aortic stenosis   . Cataract   . COPD (chronic obstructive pulmonary disease) (HCC)   . GERD (gastroesophageal reflux disease)   . HTN (hypertension)   . Hypertension   . Hypothyroidism   . Vertigo    Past Surgical History:  Procedure Laterality Date  . ABDOMINAL HYSTERECTOMY    . APPENDECTOMY    . CARPAL TUNNEL RELEASE Right   . CATARACT EXTRACTION Bilateral   . CHOLECYSTECTOMY      Allergies  Allergen Reactions  . Amoxicillin Nausea Only    Has patient had a PCN reaction causing immediate rash, facial/tongue/throat swelling, SOB or lightheadedness with hypotension:  No Has patient had a PCN reaction causing severe rash involving mucus membranes or skin necrosis: No Has patient had a PCN reaction that required hospitalization No Has patient had a PCN reaction occurring within the last 10 years: Yes If all of the above answers are "NO", then may proceed with Cephalosporin use.  Marland Kitchen Amoxicillin-Pot Clavulanate     Other reaction(s): Other (See Comments) Gi upset  . Aspirin     Other reaction(s): Other (See Comments) In large doses - swelling of eyes  . Cephalexin     Other reaction(s): Other (See Comments) GI upset  . Ciprofloxacin     Other reaction(s): Other (See Comments) GI upset  . Hydrocodone Nausea Only  . Levofloxacin     Other reaction(s): Other (See Comments) GI upset  . Metoclopramide     Other reaction(s): Other (See Comments) Other Reaction: Not Assessed  . Nabumetone     Other reaction(s): Unknown  . Rosuvastatin     Other reaction(s): Muscle Pain  . Sulfa Antibiotics Other (See Comments)  . Budesonide-Formoterol Fumarate Palpitations      Medication List       Accurate as of 08/02/16 11:59 PM. Always use your most recent med list.          acetaminophen 325 MG tablet Commonly known as:  TYLENOL Take 2 tablets (650 mg total) by mouth every 6 (six) hours as needed for mild pain (or Fever >/= 101).   albuterol (2.5 MG/3ML)  0.083% nebulizer solution Commonly known as:  PROVENTIL Inhale 2.5 mg into the lungs every 6 (six) hours as needed for wheezing.   albuterol 108 (90 Base) MCG/ACT inhaler Commonly known as:  PROVENTIL HFA;VENTOLIN HFA Inhale 2 puffs into the lungs every 4 (four) hours as needed for wheezing.   aspirin 81 MG chewable tablet Chew 81 mg by mouth every other day.   busPIRone 10 MG tablet Commonly known as:  BUSPAR Take 20 mg by mouth 2 (two) times daily.   carvedilol 6.25 MG tablet Commonly known as:  COREG Take 1 tablet (6.25 mg total) by mouth 2 (two) times daily with a meal.   cefUROXime 500  MG tablet Commonly known as:  CEFTIN Take 1 tablet (500 mg total) by mouth 2 (two) times daily with a meal. X 2 more days   chlorpheniramine-HYDROcodone 10-8 MG/5ML Suer Commonly known as:  TUSSIONEX Take 5 mLs by mouth every 12 (twelve) hours.   escitalopram 10 MG tablet Commonly known as:  LEXAPRO Take 10 mg by mouth every morning.   FISH OIL PO Take 1 capsule by mouth 2 (two) times daily.   fluticasone 110 MCG/ACT inhaler Commonly known as:  FLOVENT HFA Inhale 2 puffs into the lungs 2 (two) times daily.   furosemide 40 MG tablet Commonly known as:  LASIX Take 40 mg by mouth every morning.   guaiFENesin-dextromethorphan 100-10 MG/5ML syrup Commonly known as:  ROBITUSSIN DM Take 5 mLs by mouth every 4 (four) hours as needed for cough.   levothyroxine 75 MCG tablet Commonly known as:  SYNTHROID, LEVOTHROID Take 75 mcg by mouth daily before breakfast.   lisinopril 5 MG tablet Commonly known as:  PRINIVIL,ZESTRIL Take 1 tablet (5 mg total) by mouth daily.   LORazepam 0.5 MG tablet Commonly known as:  ATIVAN Take 1 tablet (0.5 mg total) by mouth every 8 (eight) hours.   Melatonin 3 MG Tabs Take 6 mg by mouth at bedtime.   montelukast 10 MG tablet Commonly known as:  SINGULAIR Take 10 mg by mouth every evening. At 4 pm.   pantoprazole 40 MG tablet Commonly known as:  PROTONIX Take 40 mg by mouth every morning.   potassium chloride SA 20 MEQ tablet Commonly known as:  K-DUR,KLOR-CON Take 1 tablet (20 mEq total) by mouth daily.   predniSONE 10 MG (21) Tbpk tablet Commonly known as:  STERAPRED UNI-PAK 21 TAB Take 1 tablet (10 mg total) by mouth daily. 5 tabs PO x 1 day 4 tabs PO x 1 day 3 tabs PO x 1 day 2 tabs PO x 1 day 1 tab PO x 1 day and stop   raloxifene 60 MG tablet Commonly known as:  EVISTA Take 60 mg by mouth every morning.   senna-docusate 8.6-50 MG tablet Commonly known as:  Senokot-S Take 1 tablet by mouth 2 (two) times daily as needed for  constipation.   SEREVENT DISKUS 50 MCG/DOSE diskus inhaler Generic drug:  salmeterol Inhale 1 puff into the lungs every 12 (twelve) hours.   tiotropium 18 MCG inhalation capsule Commonly known as:  SPIRIVA Place 18 mcg into inhaler and inhale every morning.   traMADol 50 MG tablet Commonly known as:  ULTRAM Take 1 tablet (50 mg total) by mouth every 6 (six) hours as needed.       Review of Systems  Constitutional: Negative for activity change, appetite change, chills, diaphoresis and fever.  HENT: Negative for congestion, sneezing, sore throat, trouble swallowing and voice change.  Respiratory: Positive for cough. Negative for apnea, choking, chest tightness, shortness of breath and wheezing.   Cardiovascular: Negative for chest pain, palpitations and leg swelling.  Gastrointestinal: Negative for abdominal distention, abdominal pain, constipation, diarrhea and nausea.  Genitourinary: Negative for difficulty urinating, dysuria, frequency and urgency.  Musculoskeletal: Positive for gait problem. Negative for back pain and myalgias. Arthralgias: typical arthritis.  Skin: Negative for color change, pallor, rash and wound.  Neurological: Positive for weakness. Negative for dizziness, tremors, syncope, speech difficulty, numbness and headaches.  Psychiatric/Behavioral: Negative for agitation and behavioral problems.  All other systems reviewed and are negative.    There is no immunization history on file for this patient. There are no preventive care reminders to display for this patient. No flowsheet data found. Functional Status Survey:    Vitals:   08/02/16 0600  BP: (!) 153/64  Pulse: (!) 56  Resp: 20  Temp: (!) 95.6 F (35.3 C)  SpO2: 99%  Weight: 122 lb 3.2 oz (55.4 kg)   Body mass index is 20.03 kg/m. Physical Exam  Constitutional: She is oriented to person, place, and time. Vital signs are normal. She appears well-developed and well-nourished. She is active and  cooperative. She does not appear ill. No distress.  HENT:  Head: Normocephalic and atraumatic.  Mouth/Throat: Uvula is midline, oropharynx is clear and moist and mucous membranes are normal. Mucous membranes are not pale, not dry and not cyanotic.  Eyes: Conjunctivae, EOM and lids are normal. Pupils are equal, round, and reactive to light.  Neck: Trachea normal, normal range of motion and full passive range of motion without pain. Neck supple. No JVD present. No tracheal deviation, no edema and no erythema present. No thyromegaly present.  Cardiovascular: Normal rate, regular rhythm, normal heart sounds, intact distal pulses and normal pulses.  Exam reveals no gallop, no distant heart sounds and no friction rub.   No murmur heard. Pulmonary/Chest: Effort normal and breath sounds normal. No accessory muscle usage. No respiratory distress. She has no wheezes. She has no rales. She exhibits no tenderness.  Abdominal: Normal appearance and bowel sounds are normal. She exhibits no distension and no ascites. There is no tenderness.  Musculoskeletal: Normal range of motion. She exhibits no edema or tenderness.  Expected osteoarthritis, stiffness  Neurological: She is alert and oriented to person, place, and time. She has normal strength.  Skin: Skin is warm, dry and intact. No rash noted. She is not diaphoretic. No cyanosis or erythema. No pallor. Nails show no clubbing.  Psychiatric: She has a normal mood and affect. Her speech is normal and behavior is normal. Judgment and thought content normal. Cognition and memory are normal.  Nursing note and vitals reviewed.   Labs reviewed:  Recent Labs  07/23/16 0415 07/24/16 0333 07/25/16 0415 07/26/16 0421  NA 139 140 141 141  K 3.6 3.8 3.6 3.5  CL 100* 100* 102 102  CO2 31 34* 29 31  GLUCOSE 200* 159* 111* 104*  BUN 29* 40* 46* 46*  CREATININE 1.18* 1.33* 1.31* 1.32*  CALCIUM 8.9 8.9 8.9 8.8*  MG 2.2 2.0 2.0  --     Recent Labs   07/20/16 1807  AST 63*  ALT 28  ALKPHOS 71  BILITOT 1.7*  PROT 7.4  ALBUMIN 3.2*    Recent Labs  07/20/16 1807  07/23/16 0415 07/24/16 0333 07/26/16 0421  WBC 16.8*  < > 15.3* 21.6* 18.8*  NEUTROABS 11.9*  --   --   --   --  HGB 12.7  < > 11.4* 11.4* 12.2  HCT 36.8  < > 33.8* 33.4* 37.4  MCV 91.1  < > 92.7 91.5 92.9  PLT 214  < > 244 319 353  < > = values in this interval not displayed. Lab Results  Component Value Date   TSH 0.195 (L) 01/23/2015   No results found for: HGBA1C No results found for: CHOL, HDL, LDLCALC, LDLDIRECT, TRIG, CHOLHDL  Significant Diagnostic Results in last 30 days:  No results found.  Assessment/Plan 1. Community acquired pneumonia, unspecified laterality  Continue working with therapies  Fall precautions  Assist with ADLs  Palliative Care Consult  Monitor sats  Breathing treatments as ordered    Family/ staff Communication:   Total Time:  Documentation:  Face to Face:  Family/Phone:   Labs/tests ordered:    Brynda Rim, NP-C Geriatrics [facility] [address] [place], [place] [address] Cell Phone (Mon-Fri 8am-5pm):  [phone] On Call:  [phone] & follow prompts after 5pm & weekends Office Phone:  [phone] Office Fax:  [phone]

## 2016-08-09 ENCOUNTER — Non-Acute Institutional Stay (SKILLED_NURSING_FACILITY): Payer: Medicare Other | Admitting: Gerontology

## 2016-08-09 DIAGNOSIS — J189 Pneumonia, unspecified organism: Secondary | ICD-10-CM | POA: Diagnosis not present

## 2016-08-16 NOTE — Progress Notes (Signed)
Location:      Place of Service:  SNF (31) Provider:  Lorenso Quarry, NP-C  Marisue Ivan, MD  Patient Care Team: Marisue Ivan, MD as PCP - General (Family Medicine)  Extended Emergency Contact Information Primary Emergency Contact: Mila Merry Address: [address]          [place], [place] [address] [place] of [place] Home Phone: [phone] Mobile Phone: [phone] Relation: Daughter Secondary Emergency Contact: Livingston,Linda Address: [address]          [address]          [place], [place] [address] [place] of [place] Home Phone: [phone] Mobile Phone: [phone] Relation: Friend  Code Status:  dnr Goals of care: Advanced Directive information Advanced Directives 07/20/2016  Does patient have an advance directive? Yes  Type of Advance Directive Living will  Copy of advanced directive(s) in chart? No - copy requested  Would patient like information on creating an advanced directive? -     Chief Complaint  Patient presents with  . Discharge Note    HPI:  Pt is a 82 y.o. female seen today for discharge evaluation from rehab s/p admission from [facility] for sepsis, community acquired pneumonia. Pt is maintaining her oxygen levels, intermittent, non-productive cough. Afebrile. Sleeping well. Denies pain and dyspnea. Progressed well with therapies to the point of a safe discharge with appropriate resources. Pt is appropriate for hospice services at home per Palliative Care NP. Pt and family feel she is ready for discharge and they are ready/ able to care for her and her future needs. Pt reports the Ativan seems to helpt the most with her breathing. Pt and daughter request pt to have an "extra" dose of Ativan prior to discharge in order to give the daughter time to get the prescriptions filled at the pharmacy. Hospice is meeting the family at her home at noon to complete paperwork for admission. VSS. No other complaints  Past Medical History:    Diagnosis Date  . Aortic stenosis   . Cataract   . COPD (chronic obstructive pulmonary disease) (HCC)   . GERD (gastroesophageal reflux disease)   . HTN (hypertension)   . Hypertension   . Hypothyroidism   . Vertigo    Past Surgical History:  Procedure Laterality Date  . ABDOMINAL HYSTERECTOMY    . APPENDECTOMY    . CARPAL TUNNEL RELEASE Right   . CATARACT EXTRACTION Bilateral   . CHOLECYSTECTOMY      Allergies  Allergen Reactions  . Amoxicillin Nausea Only    Has patient had a PCN reaction causing immediate rash, facial/tongue/throat swelling, SOB or lightheadedness with hypotension: No Has patient had a PCN reaction causing severe rash involving mucus membranes or skin necrosis: No Has patient had a PCN reaction that required hospitalization No Has patient had a PCN reaction occurring within the last 10 years: Yes If all of the above answers are "NO", then may proceed with Cephalosporin use.  Marland Kitchen Amoxicillin-Pot Clavulanate     Other reaction(s): Other (See Comments) Gi upset  . Aspirin     Other reaction(s): Other (See Comments) In large doses - swelling of eyes  . Cephalexin     Other reaction(s): Other (See Comments) GI upset  . Ciprofloxacin     Other reaction(s): Other (See Comments) GI upset  . Hydrocodone Nausea Only  . Levofloxacin     Other reaction(s): Other (See Comments) GI upset  . Metoclopramide     Other reaction(s): Other (See  Comments) Other Reaction: Not Assessed  . Nabumetone     Other reaction(s): Unknown  . Rosuvastatin     Other reaction(s): Muscle Pain  . Sulfa Antibiotics Other (See Comments)  . Budesonide-Formoterol Fumarate Palpitations      Medication List       Accurate as of 08/09/16 11:59 PM. Always use your most recent med list.          acetaminophen 325 MG tablet Commonly known as:  TYLENOL Take 2 tablets (650 mg total) by mouth every 6 (six) hours as needed for mild pain (or Fever >/= 101).   albuterol (2.5 MG/3ML)  0.083% nebulizer solution Commonly known as:  PROVENTIL Inhale 2.5 mg into the lungs every 6 (six) hours as needed for wheezing.   albuterol 108 (90 Base) MCG/ACT inhaler Commonly known as:  PROVENTIL HFA;VENTOLIN HFA Inhale 2 puffs into the lungs every 4 (four) hours as needed for wheezing.   aspirin 81 MG chewable tablet Chew 81 mg by mouth every other day.   busPIRone 10 MG tablet Commonly known as:  BUSPAR Take 20 mg by mouth 2 (two) times daily.   carvedilol 6.25 MG tablet Commonly known as:  COREG Take 1 tablet (6.25 mg total) by mouth 2 (two) times daily with a meal.   cefUROXime 500 MG tablet Commonly known as:  CEFTIN Take 1 tablet (500 mg total) by mouth 2 (two) times daily with a meal. X 2 more days   chlorpheniramine-HYDROcodone 10-8 MG/5ML Suer Commonly known as:  TUSSIONEX Take 5 mLs by mouth every 12 (twelve) hours.   escitalopram 10 MG tablet Commonly known as:  LEXAPRO Take 10 mg by mouth every morning.   FISH OIL PO Take 1 capsule by mouth 2 (two) times daily.   fluticasone 110 MCG/ACT inhaler Commonly known as:  FLOVENT HFA Inhale 2 puffs into the lungs 2 (two) times daily.   furosemide 40 MG tablet Commonly known as:  LASIX Take 40 mg by mouth every morning.   guaiFENesin-dextromethorphan 100-10 MG/5ML syrup Commonly known as:  ROBITUSSIN DM Take 5 mLs by mouth every 4 (four) hours as needed for cough.   levothyroxine 75 MCG tablet Commonly known as:  SYNTHROID, LEVOTHROID Take 75 mcg by mouth daily before breakfast.   lisinopril 5 MG tablet Commonly known as:  PRINIVIL,ZESTRIL Take 1 tablet (5 mg total) by mouth daily.   LORazepam 0.5 MG tablet Commonly known as:  ATIVAN Take 1 tablet (0.5 mg total) by mouth every 8 (eight) hours.   Melatonin 3 MG Tabs Take 6 mg by mouth at bedtime.   montelukast 10 MG tablet Commonly known as:  SINGULAIR Take 10 mg by mouth every evening. At 4 pm.   pantoprazole 40 MG tablet Commonly known as:   PROTONIX Take 40 mg by mouth every morning.   potassium chloride SA 20 MEQ tablet Commonly known as:  K-DUR,KLOR-CON Take 1 tablet (20 mEq total) by mouth daily.   predniSONE 10 MG (21) Tbpk tablet Commonly known as:  STERAPRED UNI-PAK 21 TAB Take 1 tablet (10 mg total) by mouth daily. 5 tabs PO x 1 day 4 tabs PO x 1 day 3 tabs PO x 1 day 2 tabs PO x 1 day 1 tab PO x 1 day and stop   raloxifene 60 MG tablet Commonly known as:  EVISTA Take 60 mg by mouth every morning.   senna-docusate 8.6-50 MG tablet Commonly known as:  Senokot-S Take 1 tablet by mouth 2 (two) times  daily as needed for constipation.   SEREVENT DISKUS 50 MCG/DOSE diskus inhaler Generic drug:  salmeterol Inhale 1 puff into the lungs every 12 (twelve) hours.   tiotropium 18 MCG inhalation capsule Commonly known as:  SPIRIVA Place 18 mcg into inhaler and inhale every morning.   traMADol 50 MG tablet Commonly known as:  ULTRAM Take 1 tablet (50 mg total) by mouth every 6 (six) hours as needed.       Review of Systems  Constitutional: Negative for activity change, appetite change, chills, diaphoresis and fever.  HENT: Negative for congestion, sneezing, sore throat, trouble swallowing and voice change.   Respiratory: Positive for cough. Negative for apnea, choking, chest tightness, shortness of breath and wheezing.   Cardiovascular: Negative for chest pain, palpitations and leg swelling.  Gastrointestinal: Negative for abdominal distention, abdominal pain, constipation, diarrhea and nausea.  Genitourinary: Negative for difficulty urinating, dysuria, frequency and urgency.  Musculoskeletal: Positive for gait problem. Negative for back pain and myalgias. Arthralgias: typical arthritis.  Skin: Negative for color change, pallor, rash and wound.  Neurological: Positive for weakness. Negative for dizziness, tremors, syncope, speech difficulty, numbness and headaches.  Psychiatric/Behavioral: Negative for agitation and  behavioral problems.  All other systems reviewed and are negative.    There is no immunization history on file for this patient. Pertinent  Health Maintenance Due  Topic Date Due  . DEXA SCAN  07/14/1988  . PNA vac Low Risk Adult (1 of 2 - PCV13) 07/14/1988  . INFLUENZA VACCINE  05/30/2016   No flowsheet data found. Functional Status Survey:    Vitals:   08/09/16 1600  BP: (!) 141/53  Pulse: (!) 5  Resp: 14  Temp: 97.3 F (36.3 C)  SpO2: 99%  Weight: 124 lb (56.2 kg)   Body mass index is 20.32 kg/m. Physical Exam  Constitutional: She is oriented to person, place, and time. Vital signs are normal. She appears well-developed and well-nourished. She is active and cooperative. She does not appear ill. No distress.  HENT:  Head: Normocephalic and atraumatic.  Mouth/Throat: Uvula is midline, oropharynx is clear and moist and mucous membranes are normal. Mucous membranes are not pale, not dry and not cyanotic.  Eyes: Conjunctivae, EOM and lids are normal. Pupils are equal, round, and reactive to light.  Neck: Trachea normal, normal range of motion and full passive range of motion without pain. Neck supple. No JVD present. No tracheal deviation, no edema and no erythema present. No thyromegaly present.  Cardiovascular: Normal rate, regular rhythm, normal heart sounds, intact distal pulses and normal pulses.  Exam reveals no gallop, no distant heart sounds and no friction rub.   No murmur heard. Pulmonary/Chest: Effort normal and breath sounds normal. No accessory muscle usage. No respiratory distress. She has no wheezes. She has no rales. She exhibits no tenderness.  Abdominal: Normal appearance and bowel sounds are normal. She exhibits no distension and no ascites. There is no tenderness.  Musculoskeletal: Normal range of motion. She exhibits no edema or tenderness.  Expected osteoarthritis, stiffness  Neurological: She is alert and oriented to person, place, and time. She has  normal strength.  Skin: Skin is warm, dry and intact. No rash noted. She is not diaphoretic. No cyanosis or erythema. No pallor. Nails show no clubbing.  Psychiatric: She has a normal mood and affect. Her speech is normal and behavior is normal. Judgment and thought content normal. Cognition and memory are normal.  Nursing note and vitals reviewed.   Labs reviewed:  Recent Labs  07/23/16 0415 07/24/16 0333 07/25/16 0415 07/26/16 0421  NA 139 140 141 141  K 3.6 3.8 3.6 3.5  CL 100* 100* 102 102  CO2 31 34* 29 31  GLUCOSE 200* 159* 111* 104*  BUN 29* 40* 46* 46*  CREATININE 1.18* 1.33* 1.31* 1.32*  CALCIUM 8.9 8.9 8.9 8.8*  MG 2.2 2.0 2.0  --     Recent Labs  07/20/16 1807  AST 63*  ALT 28  ALKPHOS 71  BILITOT 1.7*  PROT 7.4  ALBUMIN 3.2*    Recent Labs  07/20/16 1807  07/23/16 0415 07/24/16 0333 07/26/16 0421  WBC 16.8*  < > 15.3* 21.6* 18.8*  NEUTROABS 11.9*  --   --   --   --   HGB 12.7  < > 11.4* 11.4* 12.2  HCT 36.8  < > 33.8* 33.4* 37.4  MCV 91.1  < > 92.7 91.5 92.9  PLT 214  < > 244 319 353  < > = values in this interval not displayed. Lab Results  Component Value Date   TSH 0.195 (L) 01/23/2015   No results found for: HGBA1C No results found for: CHOL, HDL, LDLCALC, LDLDIRECT, TRIG, CHOLHDL  Significant Diagnostic Results in last 30 days:  No results found.  Assessment/Plan 1. Community acquired pneumonia, unspecified laterality  Continue working with therapies  Fall precautions  Assist with ADLs  Palliative Care Consult completed  Monitor sats  Breathing treatments as ordered  Give Ativan 0.5 mg po x 1 at 11:00- just prior to discharge from the facility  Patient is being discharged with the following home health services:  Hospice at home  Patient is being discharged with the following durable medical equipment:  Oxygen, wheelchair, gait belt, BSC  Patient has been advised to f/u with their PCP in 1-2 weeks to bring them up to  date on their rehab stay.  Social services at facility was responsible for arranging this appointment.  Pt was provided with a 30 day supply of prescriptions for medications and refills must be obtained from their PCP.  For controlled substances, a more limited supply may be provided adequate until PCP appointment only.   Family/ staff Communication:   Total Time:  Documentation:  Face to Face:  Family/Phone:   Labs/tests ordered:  Per PCP  Brynda RimShannon H. Kierre Hintz, NP-C Geriatrics [facility] [address] [place] , [place] [address] Cell Phone (Mon-Fri 8am-5pm):  [phone] On Call:  [phone] & follow prompts after 5pm & weekends Office Phone:  [phone] Office Fax:  [phone]

## 2016-10-30 DEATH — deceased
